# Patient Record
Sex: Male | Born: 1980 | Race: White | Hispanic: No | Marital: Married | Smoking: Current every day smoker
Health system: Southern US, Community
[De-identification: ages and names within clinical notes are randomized; demographics above are authoritative.]

## PROBLEM LIST (undated history)

## (undated) DIAGNOSIS — M75100 Unspecified rotator cuff tear or rupture of unspecified shoulder, not specified as traumatic: Secondary | ICD-10-CM

## (undated) DIAGNOSIS — F419 Anxiety disorder, unspecified: Secondary | ICD-10-CM

## (undated) DIAGNOSIS — R51 Headache: Secondary | ICD-10-CM

## (undated) DIAGNOSIS — M1990 Unspecified osteoarthritis, unspecified site: Secondary | ICD-10-CM

## (undated) DIAGNOSIS — E162 Hypoglycemia, unspecified: Secondary | ICD-10-CM

## (undated) DIAGNOSIS — K219 Gastro-esophageal reflux disease without esophagitis: Secondary | ICD-10-CM

## (undated) DIAGNOSIS — M25519 Pain in unspecified shoulder: Secondary | ICD-10-CM

## (undated) DIAGNOSIS — R05 Cough: Secondary | ICD-10-CM

## (undated) DIAGNOSIS — G47419 Narcolepsy without cataplexy: Secondary | ICD-10-CM

## (undated) DIAGNOSIS — R519 Headache, unspecified: Secondary | ICD-10-CM

## (undated) DIAGNOSIS — R112 Nausea with vomiting, unspecified: Secondary | ICD-10-CM

## (undated) DIAGNOSIS — Z9889 Other specified postprocedural states: Secondary | ICD-10-CM

## (undated) DIAGNOSIS — R7303 Prediabetes: Secondary | ICD-10-CM

## (undated) HISTORY — PX: CYSTECTOMY: SUR359

## (undated) HISTORY — PX: TYMPANOSTOMY TUBE PLACEMENT: SHX32

---
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
## 2004-05-02 ENCOUNTER — Emergency Department (HOSPITAL_COMMUNITY): Admission: EM | Admit: 2004-05-02 | Discharge: 2004-05-02 | Payer: Self-pay | Admitting: Emergency Medicine

## 2011-08-31 DIAGNOSIS — M25519 Pain in unspecified shoulder: Secondary | ICD-10-CM

## 2011-08-31 HISTORY — DX: Pain in unspecified shoulder: M25.519

## 2011-11-30 ENCOUNTER — Ambulatory Visit
Admission: RE | Admit: 2011-11-30 | Discharge: 2011-11-30 | Disposition: A | Discharge status: Home / Self Care | Payer: 59 | Source: Ambulatory Visit | Attending: Physician Assistant | Admitting: Physician Assistant

## 2011-11-30 ENCOUNTER — Other Ambulatory Visit: Payer: Self-pay | Admitting: Physician Assistant

## 2011-11-30 DIAGNOSIS — M25511 Pain in right shoulder: Secondary | ICD-10-CM

## 2012-05-20 ENCOUNTER — Other Ambulatory Visit: Payer: Self-pay | Admitting: Family Medicine

## 2012-05-20 DIAGNOSIS — M25511 Pain in right shoulder: Secondary | ICD-10-CM

## 2012-05-20 DIAGNOSIS — R109 Unspecified abdominal pain: Secondary | ICD-10-CM

## 2012-05-30 ENCOUNTER — Other Ambulatory Visit: Payer: 59

## 2012-06-06 ENCOUNTER — Ambulatory Visit
Admission: RE | Admit: 2012-06-06 | Discharge: 2012-06-06 | Disposition: A | Discharge status: Home / Self Care | Payer: 59 | Source: Ambulatory Visit | Attending: Family Medicine | Admitting: Family Medicine

## 2012-06-06 DIAGNOSIS — M25511 Pain in right shoulder: Secondary | ICD-10-CM

## 2012-06-06 DIAGNOSIS — M25512 Pain in left shoulder: Secondary | ICD-10-CM

## 2012-06-06 DIAGNOSIS — R109 Unspecified abdominal pain: Secondary | ICD-10-CM

## 2012-06-06 MED ORDER — IOHEXOL 300 MG/ML  SOLN
125.0000 mL | Freq: Once | INTRAMUSCULAR | Status: AC | PRN
  Administered 2012-06-06: 125 mL via INTRAVENOUS

## 2012-06-23 ENCOUNTER — Encounter (HOSPITAL_COMMUNITY): Payer: Self-pay | Admitting: Pharmacy Technician

## 2012-06-30 ENCOUNTER — Encounter (HOSPITAL_COMMUNITY): Payer: Self-pay | Admitting: *Deleted

## 2012-06-30 NOTE — Pre-Procedure Instructions (Addendum)
Your procedure is scheduled on: Wednesday, July 02, 2012 Report to [facility] Admitting [id number] Call this number if you have problems morning of your procedure:[phone]  Follow all bowel prep instructions per your doctor's orders. Dr. Dulce Sellar Do not eat or drink anything after midnight the night before your procedure. You may brush your teeth, rinse out your mouth, but no water, no food, no chewing gum, no mints, no candies, no chewing tobacco. Stopping Mobic     Take these medicines the morning of your procedure with A SIP OF WATER: Nexium   Please make arrangements for a responsible person to drive you home after the procedure. You cannot go home by cab/taxi. We recommend you have someone with you at home the first 24 hours after your procedure. Driver for procedure is  [employer]  LEAVE ALL VALUABLES, JEWELRY, BILLFOLD AT HOME.  NO DENTURES, CONTACT LENSES ALLOWED IN THE ENDOSCOPY ROOM.   YOU MAY WEAR DEODORANT, PLEASE REMOVE ALL JEWELRY, WATCHES RINGS, BODY PIERCINGS AND LEAVE AT HOME.   WOMEN: NO MAKE-UP, LOTIONS PERFUMES

## 2012-07-02 ENCOUNTER — Encounter (HOSPITAL_COMMUNITY): Payer: Self-pay | Admitting: *Deleted

## 2012-07-02 ENCOUNTER — Encounter (HOSPITAL_COMMUNITY)
Admission: RE | Disposition: A | Discharge status: Home / Self Care | Payer: Self-pay | Source: Ambulatory Visit | Attending: Gastroenterology

## 2012-07-02 ENCOUNTER — Ambulatory Visit (HOSPITAL_COMMUNITY): Payer: 59 | Admitting: Anesthesiology

## 2012-07-02 ENCOUNTER — Ambulatory Visit (HOSPITAL_COMMUNITY)
Admission: RE | Admit: 2012-07-02 | Discharge: 2012-07-02 | Disposition: A | Discharge status: Home / Self Care | Payer: 59 | Source: Ambulatory Visit | Attending: Gastroenterology | Admitting: Gastroenterology

## 2012-07-02 ENCOUNTER — Encounter (HOSPITAL_COMMUNITY): Payer: Self-pay | Admitting: Anesthesiology

## 2012-07-02 DIAGNOSIS — D129 Benign neoplasm of anus and anal canal: Secondary | ICD-10-CM | POA: Insufficient documentation

## 2012-07-02 DIAGNOSIS — D128 Benign neoplasm of rectum: Secondary | ICD-10-CM | POA: Insufficient documentation

## 2012-07-02 DIAGNOSIS — R935 Abnormal findings on diagnostic imaging of other abdominal regions, including retroperitoneum: Secondary | ICD-10-CM | POA: Insufficient documentation

## 2012-07-02 DIAGNOSIS — R109 Unspecified abdominal pain: Secondary | ICD-10-CM | POA: Insufficient documentation

## 2012-07-02 HISTORY — DX: Pain in unspecified shoulder: M25.519

## 2012-07-02 HISTORY — DX: Narcolepsy without cataplexy: G47.419

## 2012-07-02 HISTORY — DX: Gastro-esophageal reflux disease without esophagitis: K21.9

## 2012-07-02 HISTORY — PX: COLONOSCOPY WITH PROPOFOL: SHX5780

## 2012-07-02 HISTORY — DX: Hypoglycemia, unspecified: E16.2

## 2012-07-02 LAB — GLUCOSE, CAPILLARY: Glucose-Capillary: 105 mg/dL — ABNORMAL HIGH (ref 70–99)

## 2012-07-02 SURGERY — COLONOSCOPY WITH PROPOFOL
Anesthesia: General

## 2012-07-02 MED ORDER — LACTATED RINGERS IV SOLN: INTRAVENOUS | Status: DC

## 2012-07-02 MED ORDER — MIDAZOLAM HCL 5 MG/5ML IJ SOLN
INTRAMUSCULAR | Status: DC | PRN
  Administered 2012-07-02: 2 mg via INTRAVENOUS

## 2012-07-02 MED ORDER — DICYCLOMINE HCL 10 MG PO CAPS: 10.0000 mg | ORAL_CAPSULE | Freq: Four times a day (QID) | ORAL | Status: DC | PRN

## 2012-07-02 MED ORDER — ONDANSETRON HCL 4 MG/2ML IJ SOLN
INTRAMUSCULAR | Status: DC | PRN
  Administered 2012-07-02: 4 mg via INTRAVENOUS

## 2012-07-02 MED ORDER — SODIUM CHLORIDE 0.9 % IV SOLN: INTRAVENOUS | Status: DC

## 2012-07-02 MED ORDER — FENTANYL CITRATE 0.05 MG/ML IJ SOLN
INTRAMUSCULAR | Status: DC | PRN
  Administered 2012-07-02: 50 ug via INTRAVENOUS

## 2012-07-02 MED ORDER — LACTATED RINGERS IV SOLN
INTRAVENOUS | Status: DC | PRN
  Administered 2012-07-02: 10:00:00 via INTRAVENOUS

## 2012-07-02 MED ORDER — KETAMINE HCL 10 MG/ML IJ SOLN
INTRAMUSCULAR | Status: DC | PRN
  Administered 2012-07-02: 30 mg via INTRAVENOUS
  Administered 2012-07-02: 10 mg via INTRAVENOUS
  Administered 2012-07-02 (×2): 20 mg via INTRAVENOUS

## 2012-07-02 MED ORDER — PROPOFOL INFUSION 10 MG/ML OPTIME
INTRAVENOUS | Status: DC | PRN
  Administered 2012-07-02: 120 ug/kg/min via INTRAVENOUS

## 2012-07-02 SURGICAL SUPPLY — 21 items

## 2012-07-02 NOTE — Anesthesia Preprocedure Evaluation (Addendum)
Anesthesia Evaluation  Patient identified by MRN, date of birth, ID band Patient awake    Reviewed: Allergy & Precautions, H&P , NPO status , Patient's Chart, lab work & pertinent test results  Airway Mallampati: I TM Distance: >3 FB Neck ROM: Full    Dental  (+) Dental Advisory Given and Teeth Intact   Pulmonary sleep apnea ,  breath sounds clear to auscultation  Pulmonary exam normal       Cardiovascular - CAD and - Past MI Rhythm:Regular Rate:Normal     Neuro/Psych negative neurological ROS  negative psych ROS   GI/Hepatic Neg liver ROS, GERD-  Medicated,  Endo/Other  negative endocrine ROS  Renal/GU negative Renal ROS     Musculoskeletal negative musculoskeletal ROS (+)   Abdominal   Peds  Hematology negative hematology ROS (+)   Anesthesia Other Findings   Reproductive/Obstetrics                         Anesthesia Physical Anesthesia Plan  ASA: II  Anesthesia Plan: General   Post-op Pain Management:    Induction: Intravenous  Airway Management Planned: Simple Face Mask  Additional Equipment:   Intra-op Plan:   Post-operative Plan:   Informed Consent: I have reviewed the patients History and Physical, chart, labs and discussed the procedure including the risks, benefits and alternatives for the proposed anesthesia with the patient or authorized representative who has indicated his/her understanding and acceptance.   Dental advisory given  Plan Discussed with: CRNA  Anesthesia Plan Comments:         Anesthesia Quick Evaluation

## 2012-07-02 NOTE — Anesthesia Postprocedure Evaluation (Signed)
Anesthesia Post Note  Patient: Frank Moyer  Procedure(s) Performed: Procedure(s) (LRB): COLONOSCOPY WITH PROPOFOL (N/A)  Anesthesia type: MAC  Patient location: PACU  Post pain: Pain level controlled  Post assessment: Post-op Vital signs reviewed  Last Vitals: BP 143/93  Temp 36.5 C (Axillary)  Resp 17  Ht 6' (1.829 m)  Wt 226 lb (102.513 kg)  BMI 30.65 kg/m2  SpO2 94%  Post vital signs: Reviewed  Level of consciousness: awake  Complications: No apparent anesthesia complications

## 2012-07-02 NOTE — Op Note (Signed)
[facility] [address] [place], [address]   COLONOSCOPY PROCEDURE REPORT  PATIENT: Frank Moyer, Frank Moyer  MR#: [id number] BIRTHDATE: 1980-11-29 , 31  yrs. old GENDER: Male ENDOSCOPIST: Willis Modena, MD REFERRED EX:BMWUXLK Harris, M.D. PROCEDURE DATE:  07/02/2012 PROCEDURE:   Colonoscopy with biopsy ASA CLASS:   Class II INDICATIONS:abdominal pain, abnormal CT abdomen/pelvis. MEDICATIONS: MAC sedation, administered by CRNA DESCRIPTION OF PROCEDURE:   After the risks benefits and alternatives of the procedure were thoroughly explained, informed consent was obtained.  A digital rectal exam revealed no abnormalities of the rectum.   The Pentax Ped Colon [id number] endoscope was introduced through the anus and advanced to the terminal ileum which was intubated for a short distance. No adverse events experienced.   The quality of the prep was good.  The instrument was then slowly withdrawn as the colon was fully examined.   Findings:  Normal digital rectal exam; prep quality was good.  Few sigmoid diverticula.  Few hyperplastic-appearing rectosigmoid polyps, extensively biopsied with cold forceps.  No other polyps, masses, vascular ectasias, or inflammatory changes were seen. Distal 5cm of the terminal ileum was normal.  Retroflexed view of rectum was normal.      Withdrawal time was over r10 minutes     . The scope was withdrawn and the procedure completed.  ENDOSCOPIC IMPRESSION:     As above.  No colitis identified.  No explanation for patient's abdominal pain seen.  Perhaps pain could be due to the cutaneous inflammatory changes seen on CT scan around umbilicus and pubic symphysis.  RECOMMENDATIONS:     1.  Watch for potential complications of procedure. 2.  Await biopsy results; if hyperplastic, repeat colonoscopy at age 38, if adenomatous repeat 5 years. 3.  Trial of antispasmodics for abdominal pain. 4.  Follow-up with [facility] in 6-8 weeks, pending  clinical course of abdominal pain.  eSigned:  Willis Modena, MD 07/02/2012 11:09 AM   cc:

## 2012-07-02 NOTE — Transfer of Care (Signed)
Immediate Anesthesia Transfer of Care Note  Patient: Frank Moyer  Procedure(s) Performed: Procedure(s) (LRB) with comments: COLONOSCOPY WITH PROPOFOL (N/A)  Patient Location: PACU, Radiology and Endoscopy Unit  Anesthesia Type:MAC  Level of Consciousness: awake and alert   Airway & Oxygen Therapy: Patient Spontanous Breathing and Patient connected to face mask oxygen  Post-op Assessment: Report given to PACU RN and Post -op Vital signs reviewed and stable  Post vital signs: Reviewed and stable  Complications: No apparent anesthesia complications

## 2012-07-02 NOTE — H&P (Signed)
Patient interval history reviewed.  Patient examined again.  There has been no change from documented H/P dated 06/13/12 (scanned into chart from our office) except as documented above.  Assessment:  1.  Abdominal pain. 2.  Abnormal CT abd/pelvis, terminal ileal and colonic thickening.  Plan:  1.  Colonoscopy. 2.  Risks (bleeding, infection, bowel perforation that could require surgery, sedation-related changes in cardiopulmonary systems), benefits (identification and possible treatment of source of symptoms, exclusion of certain causes of symptoms), and alternatives (watchful waiting, radiographic imaging studies, empiric medical treatment) of colonoscopy were explained to patient/fiancee in detail and patient wishes to proceed.

## 2012-07-03 ENCOUNTER — Encounter (HOSPITAL_COMMUNITY): Payer: Self-pay | Admitting: Gastroenterology

## 2012-11-21 ENCOUNTER — Ambulatory Visit: Payer: 59 | Admitting: Endocrinology

## 2012-12-05 ENCOUNTER — Ambulatory Visit (INDEPENDENT_AMBULATORY_CARE_PROVIDER_SITE_OTHER): Payer: 59 | Admitting: Endocrinology

## 2012-12-05 ENCOUNTER — Ambulatory Visit: Payer: 59 | Admitting: Endocrinology

## 2012-12-05 ENCOUNTER — Encounter: Payer: Self-pay | Admitting: Endocrinology

## 2012-12-05 VITALS — BP 120/74 | HR 80 | Ht 69.0 in | Wt 216.0 lb

## 2012-12-05 DIAGNOSIS — R51 Headache: Secondary | ICD-10-CM

## 2012-12-05 DIAGNOSIS — G47419 Narcolepsy without cataplexy: Secondary | ICD-10-CM

## 2012-12-05 DIAGNOSIS — K219 Gastro-esophageal reflux disease without esophagitis: Secondary | ICD-10-CM

## 2012-12-05 DIAGNOSIS — R739 Hyperglycemia, unspecified: Secondary | ICD-10-CM

## 2012-12-05 DIAGNOSIS — F411 Generalized anxiety disorder: Secondary | ICD-10-CM

## 2012-12-05 DIAGNOSIS — R7309 Other abnormal glucose: Secondary | ICD-10-CM

## 2012-12-05 DIAGNOSIS — R519 Headache, unspecified: Secondary | ICD-10-CM | POA: Insufficient documentation

## 2012-12-05 NOTE — Patient Instructions (Addendum)
i'll review blood test results when available. You are on your way to developing diabetes.   check your blood sugar once a day.  vary the time of day when you check, between before the 3 meals, and at bedtime.  also check if you have symptoms of your blood sugar being too high or too low.  please keep a record of the readings and bring it to your next appointment here.  please call us sooner if your blood sugar goes below 70, or if you have a lot of readings over 200.   Please check a 24-hr urine test.  We'll contact you with results. Please come back for a follow-up appointment in 2 months.

## 2012-12-05 NOTE — Progress Notes (Signed)
Subjective:    Patient ID: Frank Moyer, male    DOB: 1981/04/16, 32 y.o.   MRN: [id number]  HPI Pt has 1 year of variable cbg's.  He has a few mos of moderate dizziness sensation in the head, and assoc weight loss.  he brings a record of his cbg's which i have reviewed today.  It varies from 61-400.  There is no trend throughout the day.   sxs do not correlate with cbg's.  Past Medical History  Diagnosis Date  . GERD (gastroesophageal reflux disease)   . Shoulder joint pain 08-2011  . Hypoglycemia   . Narcolepsy   . Sleep apnea     does not have cpap uses o2 2liters at hs  . On home oxygen therapy     uses 2 liters min at night    Past Surgical History  Procedure Laterality Date  . Cyst excision  1998    Bladder  . Tubes in ear    . Colonoscopy with propofol  07/02/2012    Procedure: COLONOSCOPY WITH PROPOFOL;  Surgeon: Willis Modena, MD;  Location: [facility] ENDOSCOPY;  Service: Endoscopy;  Laterality: N/A;    History   Social History  . Marital Status: Single    Spouse Name: N/A    Number of Children: N/A  . Years of Education: N/A   Occupational History  . Not on file.   Social History Main Topics  . Smoking status: Current Every Day Smoker -- 0.50 packs/day for 23 years  . Smokeless tobacco: Never Used  . Alcohol Use: No  . Drug Use: No  . Sexually Active:    Other Topics Concern  . Not on file   Social History Narrative  . No narrative on file    Current Outpatient Prescriptions on File Prior to Visit  Medication Sig Dispense Refill  . amphetamine-dextroamphetamine (ADDERALL XR) 15 MG 24 hr capsule Take 15 mg by mouth every morning.      . Armodafinil (NUVIGIL) 250 MG tablet Take 250 mg by mouth daily.      Marland Kitchen dicyclomine (BENTYL) 10 MG capsule Take 1 capsule (10 mg total) by mouth 4 (four) times daily as needed.  120 capsule  2  . esomeprazole (NEXIUM) 40 MG capsule Take 40 mg by mouth daily before breakfast.      . meloxicam (MOBIC) 15 MG tablet Take 15 mg  by mouth daily.      . Multiple Vitamin (MULTIVITAMIN WITH MINERALS) TABS Take 1 tablet by mouth daily.       No current facility-administered medications on file prior to visit.   Allergies  Allergen Reactions  . Adderall (Amphetamine-Dextroamphetamine)   . Penicillins Other (See Comments)    Been to long  . Septra (Sulfamethoxazole-Tmp Ds) Other (See Comments)    Unknown    No family history on file. DM: both parents and 1 sib BP 120/74  Pulse 80  Ht 5\' 9"  (1.753 m)  Wt 216 lb (97.977 kg)  BMI 31.88 kg/m2  SpO2 98%  Review of Systems denies fever, chest pain, sob, n/v, memory loss, depression, rhinorrhea, and easy bruising.  Pt reports fatigue, urinary frequency, acral numbness, leg cramps, blurry vision, headache, depression, excessive diaphoresis, and tremor.  He had a near-syncopal episode.      Objective:   Physical Exam VS: see vs page GEN: no distress HEAD: head: no deformity eyes: no periorbital swelling, no proptosis external nose and ears are normal mouth: no lesion seen NECK: supple, thyroid  is not enlarged CHEST WALL: no deformity LUNGS: clear to auscultation BREASTS:  No gynecomastia CV: reg rate and rhythm, no murmur ABD: abdomen is soft, nontender.  no hepatosplenomegaly.  not distended.  no hernia.   MUSCULOSKELETAL: muscle bulk and strength are grossly normal.  no obvious joint swelling.  gait is normal and steady EXTEMITIES: no deformity.  no ulcer on the feet.  feet are of normal color and temp.  no edema.  There is bilateral onychomycosis.   PULSES: dorsalis pedis intact bilat.  no carotid bruit NEURO:  cn 2-12 grossly intact.   readily moves all 4's.  sensation is intact to touch on the feet SKIN:  Normal texture and temperature.  No rash or suspicious lesion is visible.  Not diaphoretic NODES:  None palpable at the neck PSYCH: alert, oriented x3.  Does not appear anxious nor depressed.     Assessment & Plan:  Hyperglycemia: i need to know a1c  in order to interpret this.  Pt signs release of info today.  He is at high risk for DM.   Weight loss, uncertain etiology.  This suggests hyperglycemia may have been worse 6-12 months ago.   Narcolepsy.  Wife says this does not correlate with glucose, so it is very unlikely to be related.

## 2012-12-06 DIAGNOSIS — K219 Gastro-esophageal reflux disease without esophagitis: Secondary | ICD-10-CM | POA: Insufficient documentation

## 2012-12-06 DIAGNOSIS — F411 Generalized anxiety disorder: Secondary | ICD-10-CM | POA: Insufficient documentation

## 2012-12-06 DIAGNOSIS — G47419 Narcolepsy without cataplexy: Secondary | ICD-10-CM | POA: Insufficient documentation

## 2012-12-06 DIAGNOSIS — R739 Hyperglycemia, unspecified: Secondary | ICD-10-CM | POA: Insufficient documentation

## 2012-12-08 ENCOUNTER — Telehealth: Payer: Self-pay | Admitting: Endocrinology

## 2012-12-08 MED ORDER — GLUCOSE BLOOD VI STRP: ORAL_STRIP | Status: AC

## 2012-12-08 NOTE — Telephone Encounter (Signed)
Patient's significant other is requesting a CB to confirm that Rx can be done.

## 2012-12-12 LAB — METANEPHRINES, URINE, 24 HOUR: Normetanephrine, 24H Ur: 478 mcg/24 h (ref 35–482)

## 2012-12-13 LAB — CATECHOLAMINES, FRACTIONATED, URINE, 24 HOUR
Calculated Total (E+NE): 68 mcg/24 h (ref 26–121)
Creatinine, Urine mg/day-CATEUR: 2 g/(24.h) (ref 0.63–2.50)
Epinephrine, 24 hr Urine: 10 mcg/24 h (ref 2–24)
Norepinephrine, 24 hr Ur: 58 mcg/24 h (ref 15–100)

## 2012-12-15 ENCOUNTER — Telehealth: Payer: Self-pay | Admitting: Endocrinology

## 2012-12-15 NOTE — Telephone Encounter (Signed)
Frank Moyer advised of results

## 2012-12-15 NOTE — Telephone Encounter (Signed)
i reviewed labs.  Your a1c=5.0.  This is a good result.  i believe this will increase with time, so you should have rechecked in 6 months.  i would be happy to do this, or your providers at [facility] can do.

## 2012-12-15 NOTE — Telephone Encounter (Signed)
Labs on your desk for review.

## 2012-12-15 NOTE — Telephone Encounter (Signed)
Pt's fiance, Victorino Dike called. She says [facility]  was faxing lab results to our office for review. She wants to know 1-if we've gotten those results, 2-what did Dr. Everardo All think of the results, 3-does the pt have the start of DM? Please call back at [phone] / Sherri S.

## 2012-12-19 ENCOUNTER — Encounter: Payer: Self-pay | Admitting: Nurse Practitioner

## 2012-12-19 ENCOUNTER — Ambulatory Visit (INDEPENDENT_AMBULATORY_CARE_PROVIDER_SITE_OTHER): Payer: 59 | Admitting: Nurse Practitioner

## 2012-12-19 VITALS — BP 131/75 | HR 68 | Ht 71.0 in | Wt 218.0 lb

## 2012-12-19 DIAGNOSIS — G47419 Narcolepsy without cataplexy: Secondary | ICD-10-CM

## 2012-12-19 DIAGNOSIS — G4710 Hypersomnia, unspecified: Secondary | ICD-10-CM

## 2012-12-19 MED ORDER — ARMODAFINIL 250 MG PO TABS: 250.0000 mg | ORAL_TABLET | Freq: Every day | ORAL | Status: DC

## 2012-12-19 NOTE — Progress Notes (Signed)
HPI: Patient returns for followup since his last visit with Dr. Vickey Huger 06/20/2012. He has a history of severe hypersomnia. Sleep study in 2007 and 2008 was borderline sleep apnea but to mild to start CPAP at that time.  Has diagnosis of childhood ADHD and  does not want to take stimulants.He never had a PSG with MSLT. He is currently on Nuvigil doing well and his ESS score today is 4. He is not taking Adderall and Ritalin made him moody and aggressive. He has no new neurologic complaints.   ROS:  negative  Physical Exam General: well developed, well nourished, seated, in no evident distress Head: head normocephalic and atraumatic. Oropharynx benign Neck: supple with no carotid or supraclavicular bruits Cardiovascular: regular rate and rhythm, no murmurs  Neurologic Exam Mental Status: Awake and fully alert. Oriented to place and time. Follows all commands . Epworth 4.   Cranial Nerves:  Pupils equal, briskly reactive to light. Extraocular movements full without nystagmus. Visual fields full to confrontation. Hearing intact and symmetric to finger snap. Facial sensation intact. Face, tongue, palate move normally and symmetrically. Neck flexion and extension normal.  Motor: Normal bulk and tone. Normal strength in all tested extremity muscles. No focal weakness Sensory.: intact to touch and pinprick and vibratory.  Coordination: Rapid alternating movements normal in all extremities. Finger-to-nose and heel-to-shin performed accurately bilaterally. Gait and Station: Arises from chair without difficulty. Stance is normal. Gait demonstrates normal stride length and balance . Able to heel, toe and tandem walk without difficulty.  Reflexes: 2+ and symmetric. Toes downgoing.     ASSESSMENT: Hypersomnia with strong features of narcolepsy. Currently on Nuvigil doing well. Epworth 4 today     PLAN:  Continue Nuvigil 250 mg daily will refill Follow up with Dr. Vickey Huger in 6-8 months   Nilda Riggs, [place] APRN

## 2012-12-19 NOTE — Patient Instructions (Addendum)
Continue Nuvigil 250 mg daily will refill Follow up with Dr. Vickey Huger in 6-8 months ESS score excellent

## 2013-02-09 ENCOUNTER — Telehealth: Payer: Self-pay | Admitting: Endocrinology

## 2013-02-13 ENCOUNTER — Ambulatory Visit: Payer: 59 | Admitting: Endocrinology

## 2013-03-11 ENCOUNTER — Telehealth: Payer: Self-pay | Admitting: Neurology

## 2013-03-12 NOTE — Telephone Encounter (Signed)
I spoke to [employer] who had numerous concerns about patient.  The patient's PCP put him on Prozac in April and at first it worked fine, but a little over a month ago she noticed changes in him.Marland Kitchen He started falling asleep more frequently, and fell asleep at a work meeting and got written up.  Also is on light duty because of an injury at work and boss thought he was sleeping again.  She wants to know if the Prozac and Nuvigil could be working against each other.  She also went on about believing he has sleep apnea vs narcolepsy, and this may be contributing to his sleepiness.  She said he has been waking up gasping for air, having toe and leg cramps, more migraines, and urinating more at night.  Also they would like to appeal his med report done at work.  She would like him to be seen ASAP.  Please advise.

## 2013-03-13 NOTE — Telephone Encounter (Signed)
Spoke to [place] and relayed Dr. Oliva Bustard note about Prozac not causing interaction with nuvigil.  Also PSG showed severe hypersomnia and insignificant apnea.  Told her the doctor did not feel it necessary for patient to be seen.

## 2013-03-13 NOTE — Telephone Encounter (Signed)
I do not see a low dose of prozac causing interactions with nuvigil and sleepiness.  His PSG less than a year ago documented severe hypersomnia and  Insignificant apnea.  Please note that nuvigil  Effect may a have slowly decreased, and causes sleepiness.  No interaction concern on my part. Please let her know. Thanks, CD

## 2013-03-30 DIAGNOSIS — M75100 Unspecified rotator cuff tear or rupture of unspecified shoulder, not specified as traumatic: Secondary | ICD-10-CM

## 2013-03-30 HISTORY — DX: Unspecified rotator cuff tear or rupture of unspecified shoulder, not specified as traumatic: M75.100

## 2013-04-17 ENCOUNTER — Encounter (HOSPITAL_BASED_OUTPATIENT_CLINIC_OR_DEPARTMENT_OTHER): Payer: Self-pay | Admitting: *Deleted

## 2013-04-17 ENCOUNTER — Other Ambulatory Visit: Payer: Self-pay | Admitting: Orthopedic Surgery

## 2013-04-17 DIAGNOSIS — R059 Cough, unspecified: Secondary | ICD-10-CM

## 2013-04-17 HISTORY — DX: Cough, unspecified: R05.9

## 2013-04-17 NOTE — Pre-Procedure Instructions (Signed)
Discussed whether pt. should take Nuvigil AM DOS with Dr. Ivin Booty; he should take Nuvigil; pt. contacted and instructed to take Nuvigil AM DOS with sip water.  Pt. states he will get nauseated if he takes it without eating.  Instructed to bring meds. DOS, as anesthesiologist may want him to take it prior to surgery.

## 2013-04-20 ENCOUNTER — Encounter (HOSPITAL_BASED_OUTPATIENT_CLINIC_OR_DEPARTMENT_OTHER): Payer: Self-pay | Admitting: Anesthesiology

## 2013-04-20 ENCOUNTER — Ambulatory Visit (HOSPITAL_BASED_OUTPATIENT_CLINIC_OR_DEPARTMENT_OTHER): Payer: 59 | Admitting: Anesthesiology

## 2013-04-20 ENCOUNTER — Ambulatory Visit (HOSPITAL_BASED_OUTPATIENT_CLINIC_OR_DEPARTMENT_OTHER)
Admission: RE | Admit: 2013-04-20 | Discharge: 2013-04-20 | Disposition: A | Discharge status: Home / Self Care | Payer: 59 | Source: Ambulatory Visit | Attending: Orthopedic Surgery | Admitting: Orthopedic Surgery

## 2013-04-20 ENCOUNTER — Encounter (HOSPITAL_BASED_OUTPATIENT_CLINIC_OR_DEPARTMENT_OTHER): Payer: Self-pay | Admitting: *Deleted

## 2013-04-20 ENCOUNTER — Encounter (HOSPITAL_BASED_OUTPATIENT_CLINIC_OR_DEPARTMENT_OTHER)
Admission: RE | Disposition: A | Discharge status: Home / Self Care | Payer: Self-pay | Source: Ambulatory Visit | Attending: Orthopedic Surgery

## 2013-04-20 DIAGNOSIS — M75101 Unspecified rotator cuff tear or rupture of right shoulder, not specified as traumatic: Secondary | ICD-10-CM

## 2013-04-20 DIAGNOSIS — X58XXXA Exposure to other specified factors, initial encounter: Secondary | ICD-10-CM | POA: Insufficient documentation

## 2013-04-20 DIAGNOSIS — Y990 Civilian activity done for income or pay: Secondary | ICD-10-CM | POA: Insufficient documentation

## 2013-04-20 DIAGNOSIS — S43429A Sprain of unspecified rotator cuff capsule, initial encounter: Secondary | ICD-10-CM | POA: Insufficient documentation

## 2013-04-20 HISTORY — DX: Other specified postprocedural states: Z98.890

## 2013-04-20 HISTORY — DX: Headache: R51

## 2013-04-20 HISTORY — DX: Unspecified osteoarthritis, unspecified site: M19.90

## 2013-04-20 HISTORY — DX: Headache, unspecified: R51.9

## 2013-04-20 HISTORY — DX: Other specified postprocedural states: R11.2

## 2013-04-20 HISTORY — DX: Anxiety disorder, unspecified: F41.9

## 2013-04-20 HISTORY — DX: Cough: R05

## 2013-04-20 HISTORY — DX: Unspecified rotator cuff tear or rupture of unspecified shoulder, not specified as traumatic: M75.100

## 2013-04-20 HISTORY — PX: SHOULDER ARTHROSCOPY WITH ROTATOR CUFF REPAIR: SHX5685

## 2013-04-20 LAB — POCT HEMOGLOBIN-HEMACUE: Hemoglobin: 16.3 g/dL (ref 13.0–17.0)

## 2013-04-20 SURGERY — ARTHROSCOPY, SHOULDER, WITH ROTATOR CUFF REPAIR
Anesthesia: General | Site: Shoulder | Laterality: Right | Wound class: Clean

## 2013-04-20 MED ORDER — LIDOCAINE HCL (CARDIAC) 20 MG/ML IV SOLN
INTRAVENOUS | Status: DC | PRN
  Administered 2013-04-20: 100 mg via INTRAVENOUS

## 2013-04-20 MED ORDER — HYDROCODONE-ACETAMINOPHEN 5-325 MG PO TABS: 1.0000 | ORAL_TABLET | ORAL | Status: DC | PRN

## 2013-04-20 MED ORDER — DEXAMETHASONE SODIUM PHOSPHATE 4 MG/ML IJ SOLN
INTRAMUSCULAR | Status: DC | PRN
  Administered 2013-04-20: 10 mg via INTRAVENOUS

## 2013-04-20 MED ORDER — OXYCODONE HCL 5 MG/5ML PO SOLN: 5.0000 mg | Freq: Once | ORAL | Status: DC | PRN

## 2013-04-20 MED ORDER — PROPOFOL 10 MG/ML IV BOLUS
INTRAVENOUS | Status: DC | PRN
  Administered 2013-04-20: 200 mg via INTRAVENOUS

## 2013-04-20 MED ORDER — LACTATED RINGERS IV SOLN
INTRAVENOUS | Status: DC
  Administered 2013-04-20: 20 mL/h via INTRAVENOUS
  Administered 2013-04-20 (×2): via INTRAVENOUS

## 2013-04-20 MED ORDER — MIDAZOLAM HCL 2 MG/2ML IJ SOLN
1.0000 mg | INTRAMUSCULAR | Status: DC | PRN
  Administered 2013-04-20: 1 mg via INTRAVENOUS

## 2013-04-20 MED ORDER — FENTANYL CITRATE 0.05 MG/ML IJ SOLN
INTRAMUSCULAR | Status: DC | PRN
  Administered 2013-04-20: 100 ug via INTRAVENOUS

## 2013-04-20 MED ORDER — ONDANSETRON HCL 4 MG/2ML IJ SOLN: 4.0000 mg | Freq: Once | INTRAMUSCULAR | Status: DC | PRN

## 2013-04-20 MED ORDER — BUPIVACAINE-EPINEPHRINE PF 0.5-1:200000 % IJ SOLN
INTRAMUSCULAR | Status: DC | PRN
  Administered 2013-04-20: 30 mL

## 2013-04-20 MED ORDER — ONDANSETRON HCL 4 MG/2ML IJ SOLN
INTRAMUSCULAR | Status: DC | PRN
  Administered 2013-04-20: 4 mg via INTRAVENOUS

## 2013-04-20 MED ORDER — FENTANYL CITRATE 0.05 MG/ML IJ SOLN
50.0000 ug | INTRAMUSCULAR | Status: DC | PRN
  Administered 2013-04-20: 50 ug via INTRAVENOUS

## 2013-04-20 MED ORDER — OXYCODONE HCL 5 MG PO TABS
5.0000 mg | ORAL_TABLET | Freq: Once | ORAL | Status: DC | PRN
Start: 2013-04-20 — End: 2013-04-20

## 2013-04-20 MED ORDER — HYDROMORPHONE HCL PF 1 MG/ML IJ SOLN
0.2500 mg | INTRAMUSCULAR | Status: DC | PRN
  Administered 2013-04-20 (×2): 0.5 mg via INTRAVENOUS

## 2013-04-20 MED ORDER — SODIUM CHLORIDE 0.9 % IR SOLN
Status: DC | PRN
  Administered 2013-04-20 (×2): 3000 mL

## 2013-04-20 MED ORDER — SUCCINYLCHOLINE CHLORIDE 20 MG/ML IJ SOLN
INTRAMUSCULAR | Status: DC | PRN
  Administered 2013-04-20: 50 mg via INTRAVENOUS

## 2013-04-20 MED ORDER — MIDAZOLAM HCL 2 MG/ML PO SYRP: 12.0000 mg | ORAL_SOLUTION | Freq: Once | ORAL | Status: DC | PRN

## 2013-04-20 MED ORDER — CLINDAMYCIN PHOSPHATE 600 MG/50ML IV SOLN
INTRAVENOUS | Status: DC | PRN
  Administered 2013-04-20: 600 mg via INTRAVENOUS

## 2013-04-20 MED ORDER — MEPERIDINE HCL 25 MG/ML IJ SOLN: 6.2500 mg | INTRAMUSCULAR | Status: DC | PRN

## 2013-04-20 SURGICAL SUPPLY — 78 items
ANCHOR CORKSCREW FIBER 5.5X15 (Anchor) ×2 IMPLANT
BENZOIN TINCTURE PRP APPL 2/3 (GAUZE/BANDAGES/DRESSINGS) IMPLANT
BLADE SURG 15 STRL LF DISP TIS (BLADE) IMPLANT
BLADE SURG 15 STRL SS (BLADE)
BLADE SURG ROTATE 9660 (MISCELLANEOUS) IMPLANT
BUR OVAL 4.0 (BURR) ×2 IMPLANT
CANISTER OMNI JUG 16 LITER (MISCELLANEOUS) ×2 IMPLANT
CANISTER SUCTION 2500CC (MISCELLANEOUS) IMPLANT
CANNULA 5.75X71 LONG (CANNULA) ×2 IMPLANT
CANNULA TWIST IN 8.25X7CM (CANNULA) ×2 IMPLANT
CHLORAPREP W/TINT 26ML (MISCELLANEOUS) ×2 IMPLANT
CLOTH BEACON ORANGE TIMEOUT ST (SAFETY) ×2 IMPLANT
DECANTER SPIKE VIAL GLASS SM (MISCELLANEOUS) IMPLANT
DERMABOND ADVANCED (GAUZE/BANDAGES/DRESSINGS)
DERMABOND ADVANCED .7 DNX12 (GAUZE/BANDAGES/DRESSINGS) IMPLANT
DRAPE INCISE IOBAN 66X45 STRL (DRAPES) ×2 IMPLANT
DRAPE STERI 35X30 U-POUCH (DRAPES) ×2 IMPLANT
DRAPE SURG 17X23 STRL (DRAPES) ×2 IMPLANT
DRAPE U 20/CS (DRAPES) ×2 IMPLANT
DRAPE U-SHAPE 47X51 STRL (DRAPES) ×2 IMPLANT
DRAPE U-SHAPE 76X120 STRL (DRAPES) ×4 IMPLANT
DRSG PAD ABDOMINAL 8X10 ST (GAUZE/BANDAGES/DRESSINGS) ×2 IMPLANT
ELECT REM PT RETURN 9FT ADLT (ELECTROSURGICAL) ×2
ELECTRODE REM PT RTRN 9FT ADLT (ELECTROSURGICAL) ×1 IMPLANT
GAUZE SPONGE 4X4 16PLY XRAY LF (GAUZE/BANDAGES/DRESSINGS) IMPLANT
GAUZE XEROFORM 1X8 LF (GAUZE/BANDAGES/DRESSINGS) ×2 IMPLANT
GLOVE BIO SURGEON STRL SZ7 (GLOVE) ×2 IMPLANT
GLOVE BIO SURGEON STRL SZ7.5 (GLOVE) ×2 IMPLANT
GLOVE BIOGEL PI IND STRL 7.0 (GLOVE) ×2 IMPLANT
GLOVE BIOGEL PI IND STRL 8 (GLOVE) ×1 IMPLANT
GLOVE BIOGEL PI INDICATOR 7.0 (GLOVE) ×2
GLOVE BIOGEL PI INDICATOR 8 (GLOVE) ×1
GLOVE ECLIPSE 6.5 STRL STRAW (GLOVE) ×2 IMPLANT
GOWN BRE IMP PREV XXLGXLNG (GOWN DISPOSABLE) ×2 IMPLANT
GOWN PREVENTION PLUS XLARGE (GOWN DISPOSABLE) ×4 IMPLANT
NDL SUT 6 .5 CRC .975X.05 MAYO (NEEDLE) IMPLANT
NEEDLE 1/2 CIR CATGUT .05X1.09 (NEEDLE) IMPLANT
NEEDLE MAYO TAPER (NEEDLE)
NEEDLE SCORPION MULTI FIRE (NEEDLE) ×2 IMPLANT
NS IRRIG 1000ML POUR BTL (IV SOLUTION) IMPLANT
PACK ARTHROSCOPY DSU (CUSTOM PROCEDURE TRAY) ×2 IMPLANT
PACK BASIN DAY SURGERY FS (CUSTOM PROCEDURE TRAY) ×2 IMPLANT
PENCIL BUTTON HOLSTER BLD 10FT (ELECTRODE) IMPLANT
PUSHLOCK PEEK 4.5X24 (Orthopedic Implant) ×2 IMPLANT
RESECTOR FULL RADIUS 4.2MM (BLADE) ×2 IMPLANT
SLEEVE SCD COMPRESS KNEE MED (MISCELLANEOUS) ×2 IMPLANT
SLING ARM FOAM STRAP LRG (SOFTGOODS) IMPLANT
SLING ARM FOAM STRAP MED (SOFTGOODS) IMPLANT
SLING ARM FOAM STRAP XLG (SOFTGOODS) IMPLANT
SLING ARM IMMOBILIZER LRG (SOFTGOODS) ×2 IMPLANT
SLING ARM IMMOBILIZER MED (SOFTGOODS) IMPLANT
SPONGE GAUZE 4X4 12PLY (GAUZE/BANDAGES/DRESSINGS) ×2 IMPLANT
SPONGE LAP 4X18 X RAY DECT (DISPOSABLE) IMPLANT
STRIP CLOSURE SKIN 1/2X4 (GAUZE/BANDAGES/DRESSINGS) IMPLANT
SUCTION FRAZIER TIP 10 FR DISP (SUCTIONS) IMPLANT
SUPPORT WRAP ARM LG (MISCELLANEOUS) ×2 IMPLANT
SUT 2 FIBERLOOP 20 STRT BLUE (SUTURE)
SUT BONE WAX W31G (SUTURE) IMPLANT
SUT ETHILON 3 0 PS 1 (SUTURE) ×2 IMPLANT
SUT FIBERWIRE #2 38 T-5 BLUE (SUTURE)
SUT MNCRL AB 3-0 PS2 18 (SUTURE) IMPLANT
SUT MNCRL AB 4-0 PS2 18 (SUTURE) IMPLANT
SUT PDS AB 0 CT 36 (SUTURE) IMPLANT
SUT PROLENE 3 0 PS 2 (SUTURE) IMPLANT
SUT VIC AB 0 CT1 27 (SUTURE)
SUT VIC AB 0 CT1 27XBRD ANBCTR (SUTURE) IMPLANT
SUT VIC AB 2-0 SH 27 (SUTURE)
SUT VIC AB 2-0 SH 27XBRD (SUTURE) IMPLANT
SUTURE 2 FIBERLOOP 20 STRT BLU (SUTURE) IMPLANT
SUTURE FIBERWR #2 38 T-5 BLUE (SUTURE) IMPLANT
SYR BULB 3OZ (MISCELLANEOUS) IMPLANT
TOWEL OR 17X24 6PK STRL BLUE (TOWEL DISPOSABLE) ×2 IMPLANT
TOWEL OR NON WOVEN STRL DISP B (DISPOSABLE) ×2 IMPLANT
TUBE CONNECTING 20X1/4 (TUBING) ×2 IMPLANT
TUBING ARTHROSCOPY IRRIG 16FT (MISCELLANEOUS) ×2 IMPLANT
WAND STAR VAC 90 (SURGICAL WAND) ×2 IMPLANT
WATER STERILE IRR 1000ML POUR (IV SOLUTION) ×2 IMPLANT
YANKAUER SUCT BULB TIP NO VENT (SUCTIONS) IMPLANT

## 2013-04-20 NOTE — Op Note (Signed)
Procedure(s): SHOULDER ARTHROSCOPY WITH POSTERIOR ROTATOR CUFF REPAIR and debridement Procedure Note  Frank Moyer male 32 y.o. 04/20/2013  Procedure(s) and Anesthesia Type:    #1 right shoulder arthroscopic infraspinatus repair #2 right shoulder extensive debridement of partial subscapularis and supraspinatus tear  Surgeon(s) and Role:    * Mable Paris, MD - Primary     Surgeon: Mable Paris   Assistants:Danielle Courtney Heys PA-C (Danielle was present and scrubbed throughout the procedure and was essential in positioning, assisting with the camera and instrumentation,, and closure)  Anesthesia: General endotracheal anesthesia with preoperative interscalene block    Procedure Detail  Estimated Blood Loss: Min         Drains: none  Blood Given: none         Specimens: none        Complications:  * No complications entered in OR log *         Disposition: PACU - hemodynamically stable.         Condition: stable    Procedure:   INDICATIONS FOR SURGERY: The patient is 32 y.o. male who had a right shoulder injury at work and suffered a complete tear of the infraspinatus. He was indicated for surgical treatment to restore strength and function of the right shoulder and decrease pain. He understood risks benefits and alternatives to the procedure including but not limited to incomplete healing, incomplete pain relief, stiffness etc. He wished to go forward with surgery.  OPERATIVE FINDINGS: Examination under anesthesia: No stiffness or instability Diagnostic Arthroscopy:  Glenoid articular cartilage: Intact Humeral head articular cartilage: Intact Labrum: Intact Loose bodies: None Synovitis: Mild Articular sided rotator cuff: He had significant partial tearing of the articular surface of the subscapularis involving about 20-25% of the thickness of the tendon which was debrided back to healthy bleeding tissue. He also had partial-thickness tearing of  the anterior articular side of the supraspinatus tendon involving 10-20% of the tendon which was debrided. The posterior articular side of the infraspinatus was actually intact Bursal sided rotator cuff: He had very high grade 90% bursal sided partial tear of the infraspinatus which was completed and repaired Coracoacromial ligament: No significant fraying  DESCRIPTION OF PROCEDURE: The patient was identified in preoperative  holding area where I personally marked the operative site after  verifying site, side, and procedure with the patient. An interscalene block was given by the attending anesthesiologist the holding area.  The patient was taken back to the operating room where general anesthesia was induced without complication and was placed in the beach-chair position with the back  elevated about 60 degrees and all extremities and head and neck carefully padded and  positioned.   The right upper extremity was then prepped and  draped in a standard sterile fashion. The appropriate time-out  procedure was carried out. The patient did receive IV antibiotics  within 30 minutes of incision.   A small posterior portal incision was made and the arthroscope was introduced into the joint. An anterior portal was then established above the subscapularis using needle localization. Small cannula was placed anteriorly. Diagnostic arthroscopy was then carried out with findings as described above.  He was noted to have extensive partial thickness tearing of the articular surface of the subscapularis involving up to about 25-30% of the tendon thickness. This was debrided back to healthy bleeding tendon with a shaver through the anterior intra-articular portal. The remaining tendon was healthy and intact and no repair was necessary. The supraspinatus also had extensive  partial tearing with significant fraying of the articular side of the tendon which was debrided back to healthy bleeding tendon. There is no  full-thickness extension. This involved about 10-20% of the thickness of the tendon and no repair was necessary. The articular sided infraspinatus tendon was carefully examined and was viewed from both anterior and posterior portals and was found to be completely intact. The biceps tendon and superior labrum were intact. Articular surfaces were intact. No loose bodies were noted.  The arthroscope was then introduced into the subacromial space a standard lateral portal was established with needle localization. The shaver was used through the lateral portal to perform extensive bursectomy. Coracoacromial ligament was examined and found to be intact. After removing the bursa the anterior portion of the rotator cuff was noted to be completely intact. Initially the posterior portion was felt to be largely intact but after careful probing it was determined that there was some scar tissue in that there was a fairly significant tear in the infraspinatus which involved about 90% of the footprint of the infraspinatus on the greater tuberosity. Once the torn tissue was debrided away the healthy edge of the tendon was mobilized and was able to be pulled over the tuberosity. Therefore the tuberosity was debrided with a bur down to bleeding bone to promote healing. A 5.5 peak corkscrew anchor was placed just off the articular margin after completing the tear through the medial strand with a 11 blade. The 4 suture strands from the anchor were passed evenly spaced in the tear and the medial row was tied down. This brought the tendon nicely down over the prepared tuberosity. All 4 strands were then brought into a 4.5 mm peak push lock anchor in a lateral row bringing the tendon nicely down over the tuberosity. The repair was felt to be excellent. No undue tension on the repair. The remainder of the bursal rotator cuff is intact. There is no significant sign of impingement in the coracoacromial ligament was not taken off the  anterior acromion.  The arthroscopic equipment was removed from the joint and the portals were closed with 3-0 nylon in an interrupted fashion. Sterile dressings were then applied including Xeroform 4 x 4's ABDs and tape. The patient was then allowed to awaken from general anesthesia, placed in a sling, transferred to the stretcher and taken to the recovery room in stable condition.   POSTOPERATIVE PLAN: The patient will be discharged home today and will followup in one week for suture removal and wound check.  He will follow the standard cuff protocol.

## 2013-04-20 NOTE — H&P (Signed)
Frank Moyer is an 32 y.o. male.   Chief Complaint: R shoulder RC tear  HPI: S/p injury at work with complete infraspinatus tear   Past Medical History  Diagnosis Date  . GERD (gastroesophageal reflux disease)   . Shoulder joint pain 08-2011  . Hypoglycemia   . Narcolepsy   . PONV (postoperative nausea and vomiting)   . Sinus headache   . Cough 04/17/2013    to start antibiotic 04/17/2013  . Arthritis     right shoulder  . Rotator cuff tear 03/2013    right  . Anxiety     Past Surgical History  Procedure Laterality Date  . Cyst excision  1998    bladder  . Tympanostomy tube placement      as a child  . Colonoscopy with propofol  07/02/2012    Procedure: COLONOSCOPY WITH PROPOFOL;  Surgeon: Willis Modena, MD;  Location: [facility];  Service: Endoscopy;  Laterality: N/A;    Family History  Problem Relation Age of Onset  . Heart disease Father     MI   Social History:  reports that he has been smoking Cigarettes.  He has a 25 pack-year smoking history. He has never used smokeless tobacco. He reports that he does not drink alcohol or use illicit drugs.  Allergies:  Allergies  Allergen Reactions  . Adderall [Amphetamine-Dextroamphetamine] Other (See Comments)    MENTAL STATUS CHANGES  . Penicillins Other (See Comments)    UNKNOWN - AS A CHILD  . Septra [Sulfamethoxazole-Tmp Ds] Other (See Comments)    UNKNOWN     Medications Prior to Admission  Medication Sig Dispense Refill  . Armodafinil (NUVIGIL) 250 MG tablet Take 1 tablet (250 mg total) by mouth daily.  30 tablet  5  . esomeprazole (NEXIUM) 40 MG capsule Take 40 mg by mouth daily before breakfast.      . FLUoxetine (PROZAC) 20 MG tablet Take 10 mg by mouth daily.       Marland Kitchen levofloxacin (LEVAQUIN) 500 MG tablet Take 500 mg by mouth daily.      . meloxicam (MOBIC) 15 MG tablet Take 15 mg by mouth daily.      . vitamin B-12 (CYANOCOBALAMIN) 1000 MCG tablet Take 1,000 mcg by mouth daily.      Marland Kitchen glucose blood test  strip Use as instructed  100 each  12    Results for orders placed during the hospital encounter of 04/20/13 (from the past 48 hour(s))  POCT HEMOGLOBIN-HEMACUE     Status: None   Collection Time    04/20/13 10:04 AM      Result Value Range   Hemoglobin 16.3  13.0 - 17.0 g/dL   No results found.  Review of Systems  All other systems reviewed and are negative.    Blood pressure 130/77, pulse 79, temperature 98.2 F (36.8 C), temperature source Oral, resp. rate 16, height 6' (1.829 m), weight 96.163 kg (212 lb), SpO2 95.00%. Physical Exam  Constitutional: He is oriented to person, place, and time. He appears well-developed and well-nourished.  HENT:  Head: Atraumatic.  Eyes: EOM are normal.  Cardiovascular: Intact distal pulses.   Respiratory: Effort normal.  Musculoskeletal:       Right shoulder: He exhibits decreased range of motion, pain and decreased strength.  Neurological: He is alert and oriented to person, place, and time.  Skin: Skin is warm and dry.  Psychiatric: He has a normal mood and affect.     Assessment/Plan R infraspinatus tear  Plan arthroscopic repair Risks / benefits of surgery discussed Consent on chart  NPO for OR Preop antibiotics   Kateena Degroote WILLIAM 04/20/2013, 11:34 AM

## 2013-04-20 NOTE — Anesthesia Procedure Notes (Addendum)
Anesthesia Regional Block:  Interscalene brachial plexus block  Pre-Anesthetic Checklist: ,, timeout performed, Correct Patient, Correct Site, Correct Laterality, Correct Procedure, Correct Position, site marked, Risks and benefits discussed,  Surgical consent,  Pre-op evaluation,  At surgeon's request and post-op pain management  Laterality: Right  Prep: chloraprep       Needles:  Injection technique: Single-shot  Needle Type: Echogenic Stimulator Needle     Needle Length: 5cm 5 cm Needle Gauge: 21 and 21 G    Additional Needles:  Procedures: ultrasound guided (picture in chart) and nerve stimulator Interscalene brachial plexus block  Nerve Stimulator or Paresthesia:  Response: 0.4 mA,   Additional Responses:   Narrative:  Start time: 04/20/2013 10:18 AM End time: 04/20/2013 10:28 AM Injection made incrementally with aspirations every 5 mL.  Performed by: Personally  Anesthesiologist: Arta Bruce MD  Additional Notes: Monitors applied. Patient sedated. Sterile prep and drape,hand hygiene and sterile gloves were used. Relevant anatomy identified.Needle position confirmed.Local anesthetic injected incrementally after negative aspiration. Local anesthetic spread visualized around nerve(s). Vascular puncture avoided. No complications. Image printed for medical record.The patient tolerated the procedure well.       Interscalene brachial plexus block Procedure Name: Intubation Date/Time: 04/20/2013 11:55 AM Performed by: Zenia Resides D Pre-anesthesia Checklist: Patient identified, Emergency Drugs available, Suction available and Patient being monitored Patient Re-evaluated:Patient Re-evaluated prior to inductionOxygen Delivery Method: Circle System Utilized Preoxygenation: Pre-oxygenation with 100% oxygen Intubation Type: IV induction Ventilation: Mask ventilation without difficulty Laryngoscope Size: Mac and 3 Grade View: Grade I Tube type: Oral Tube size: 8.0  mm Number of attempts: 1 Airway Equipment and Method: stylet and oral airway Placement Confirmation: ETT inserted through vocal cords under direct vision,  positive ETCO2 and breath sounds checked- equal and bilateral Secured at: 22 cm Tube secured with: Tape Dental Injury: Teeth and Oropharynx as per pre-operative assessment

## 2013-04-20 NOTE — Transfer of Care (Signed)
Immediate Anesthesia Transfer of Care Note  Patient: Frank Moyer  Procedure(s) Performed: Procedure(s) with comments: SHOULDER ARTHROSCOPY WITH POSTERIOR ROTATOR CUFF REPAIR and debridement (Right) - Right shoulder arthroscopy posterior rotator cuff repair and debridement  Patient Location: PACU  Anesthesia Type:General and Regional  Level of Consciousness: awake  Airway & Oxygen Therapy: Patient Spontanous Breathing and Patient connected to face mask oxygen  Post-op Assessment: Report given to PACU RN and Post -op Vital signs reviewed and stable  Post vital signs: Reviewed and stable  Complications: No apparent anesthesia complications

## 2013-04-20 NOTE — Progress Notes (Signed)
Assisted Dr. Ossey with right, ultrasound guided, interscalene  block. Side rails up, monitors on throughout procedure. See vital signs in flow sheet. Tolerated Procedure well. 

## 2013-04-20 NOTE — Anesthesia Preprocedure Evaluation (Addendum)
Anesthesia Evaluation  Patient identified by MRN, date of birth, ID band Patient awake    Reviewed: Allergy & Precautions, H&P , NPO status , Patient's Chart, lab work & pertinent test results  History of Anesthesia Complications (+) PONV  Airway Mallampati: I TM Distance: >3 FB Neck ROM: Full    Dental   Pulmonary          Cardiovascular     Neuro/Psych Anxiety H/O narcolepsy on Nuvigil    GI/Hepatic GERD-  Medicated and Controlled,  Endo/Other    Renal/GU      Musculoskeletal   Abdominal   Peds  Hematology   Anesthesia Other Findings   Reproductive/Obstetrics                          Anesthesia Physical Anesthesia Plan  ASA: II  Anesthesia Plan: General   Post-op Pain Management:    Induction: Intravenous  Airway Management Planned: Oral ETT  Additional Equipment:   Intra-op Plan:   Post-operative Plan: Extubation in OR  Informed Consent: I have reviewed the patients History and Physical, chart, labs and discussed the procedure including the risks, benefits and alternatives for the proposed anesthesia with the patient or authorized representative who has indicated his/her understanding and acceptance.     Plan Discussed with: CRNA and Surgeon  Anesthesia Plan Comments:         Anesthesia Quick Evaluation

## 2013-04-20 NOTE — Anesthesia Postprocedure Evaluation (Signed)
Anesthesia Post Note  Patient: Frank Moyer  Procedure(s) Performed: Procedure(s) (LRB): SHOULDER ARTHROSCOPY WITH POSTERIOR ROTATOR CUFF REPAIR and debridement (Right)  Anesthesia type: general  Patient location: PACU  Post pain: Pain level controlled  Post assessment: Patient's Cardiovascular Status Stable  Last Vitals:  Filed Vitals:   04/20/13 1513  BP: 125/79  Pulse: 82  Temp: 36.6 C  Resp: 20    Post vital signs: Reviewed and stable  Level of consciousness: sedated  Complications: No apparent anesthesia complications

## 2013-04-21 ENCOUNTER — Encounter (HOSPITAL_BASED_OUTPATIENT_CLINIC_OR_DEPARTMENT_OTHER): Payer: Self-pay | Admitting: Orthopedic Surgery

## 2013-04-21 NOTE — Addendum Note (Signed)
Addendum created 04/21/13 0831 by Lance Coon, CRNA   Modules edited: Anesthesia Responsible Staff

## 2013-05-08 ENCOUNTER — Other Ambulatory Visit: Payer: Self-pay | Admitting: Neurology

## 2013-05-08 MED ORDER — ARMODAFINIL 250 MG PO TABS: 250.0000 mg | ORAL_TABLET | Freq: Every day | ORAL | Status: DC

## 2013-06-11 NOTE — Telephone Encounter (Signed)
completed

## 2013-07-10 ENCOUNTER — Ambulatory Visit: Payer: 59 | Admitting: Nurse Practitioner

## 2013-07-31 ENCOUNTER — Ambulatory Visit: Payer: Self-pay | Admitting: Neurology

## 2013-09-04 ENCOUNTER — Ambulatory Visit (INDEPENDENT_AMBULATORY_CARE_PROVIDER_SITE_OTHER): Payer: 59 | Admitting: Neurology

## 2013-09-04 ENCOUNTER — Encounter: Payer: Self-pay | Admitting: Neurology

## 2013-09-04 ENCOUNTER — Encounter (INDEPENDENT_AMBULATORY_CARE_PROVIDER_SITE_OTHER): Payer: Self-pay

## 2013-09-04 VITALS — BP 141/95 | HR 95 | Ht 71.5 in | Wt 226.0 lb

## 2013-09-04 DIAGNOSIS — F603 Borderline personality disorder: Secondary | ICD-10-CM

## 2013-09-04 DIAGNOSIS — G4710 Hypersomnia, unspecified: Secondary | ICD-10-CM

## 2013-09-04 MED ORDER — ARMODAFINIL 250 MG PO TABS
250.0000 mg | ORAL_TABLET | Freq: Every day | ORAL | Status: DC
Start: 2013-09-04 — End: 2014-03-11

## 2013-09-04 MED ORDER — ARMODAFINIL 50 MG PO TABS: 100.0000 mg | ORAL_TABLET | Freq: Every day | ORAL | Status: DC

## 2013-09-04 NOTE — Patient Instructions (Signed)
Narcolepsy Narcolepsy is a disabling neurological disorder of sleep regulation. It affects the control of sleep. It also affects the control of wakefulness. It is an interruption of the dreaming state of sleep. This state is known as REM or rapid eye movement sleep.  SYMPTOMS  The development, number, and severity of symptoms vary widely among people with the disorder. Symptoms generally begin between the ages of 15 and 30. The four classic symptoms of the disorder are:   Excessive daytime sleepiness.  Cataplexy. This is sudden, brief episodes of muscle weakness or paralysis. It is caused by strong emotions. Common strong emotions are laughter, anger, surprise, or anticipation.  Sleep paralysis. This is paralysis upon falling asleep or waking up.  Hallucinations. These are vivid dream-like images that occur at when you first fall asleep. Other symptoms include:   Unrelenting excessive sleepiness. This is usually the first and most obvious symptom.  Sleep attacks. Patients have strong sleep attacks throughout the day. These attacks can last for 30 seconds to more than 30 minutes. These happen no matter how much or how well the person slept the night before. These attacks end up making the person sleep at work and social events. The person can fall asleep while eating, talking, and driving. They also fall asleep at other out of place times.  Disturbed nighttime sleep.  Tossing and turning in bed.  Leg jerks.  Nightmares.  Waking up often. DIAGNOSIS  It's possible that genetics play a role in this disorder. Narcolepsy is not a rare disorder. It is often misdiagnosed. It is often diagnosed years after symptoms first appear. Early diagnosis and treatment are important. This help the physical and mental well-being of the patient. TREATMENT  There is no cure for narcolepsy. The symptoms can be controlled with behavioral and medical therapy. The excessive daytime sleepiness may be treated with  stimulant drugs. It may also be treated with the drug modafinil (Provigil). Cataplexy and other REM-sleep symptoms may be treated with antidepressant medications. Medications will reduce the symptoms. Medications will not ease symptoms entirely. Many available medications have side effects. Basic lifestyle changes may also reduce the symptoms. These changes include having regular sleep schedules and scheduled daytime naps. Other lifestyle changes include avoiding "over-stimulating" situations. Document Released: 07/06/2002 Document Revised: 10/08/2011 Document Reviewed: 07/16/2005 Exit[employer] Patient Information 2014 [employer].  

## 2013-09-04 NOTE — Progress Notes (Signed)
[name] returns today for a regular OB visit.  The patient has a history of severe hypersomnia, he also had responded to stimulants wears impulsivity, personality changes became angry and mean. He also had a paradoxical reaction as he became actually sleepy on stimulants.  Just recently his primary care physician tried Cymbalta and the patient again seemed to have a paradoxical sleep induction from this medication.  He had only taken it for 3 days it could be discontinued with old having to be weaned. Is no longer taking any stimulants but he continues to take Provigil 250 mg plus half tablet of 50 mg. He has no neurologic complaints.  The sleep habits are as follows the patient was to bed between 10 and 11 PM and he has to arise at 4:15 to 4:30 in the morning.  Overall he does not get a whole lot of sleep bili 6 hours but is working on advancing his bedtime further.  He denies any headaches or any jitteriness related to the Nuvigil ,   no palpitations no diaphoresis. 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  The patient's fianc asked if it could have been the [place] gas that contributed at his workplace to some of the sleepiness in the past. Also cannot exclude this,  I think it is unlikely that they would affect him over and over  again.   HPI: last office visit : Patient returns for followup since his last visit with Dr. Brett Fairy 06/20/2012. He has a history of severe hypersomnia. Sleep study in 2007 and 2008 was borderline sleep apnea but to mild to start CPAP at that time.  Has diagnosis of childhood ADHD and  does not want to take stimulants.He never had a PSG with MSLT. He is currently on Nuvigil doing well and his ESS score today is 4. He is not taking Adderall and  Ritalin made him moody and aggressive. He has no new neurologic complaints.     ROS:  negative  Physical Exam General: well developed, well nourished, seated, in no evident distress Head: head normocephalic and atraumatic. Oropharynx benign Neck: supple with no carotid or supraclavicular bruits Cardiovascular: regular rate and rhythm, no murmurs  Neurologic Exam Mental Status: Awake and fully alert. Oriented to place and time. Follows all commands . Epworth 4.   Cranial Nerves:  Pupils equal, briskly reactive to light. Extraocular movements full without nystagmus. Visual fields full to confrontation. Hearing intact and symmetric to finger snap. Facial sensation intact. Face, tongue, palate move normally and symmetrically. Neck flexion and extension normal.  Motor: Normal bulk and tone. Normal strength in all tested extremity muscles. No focal weakness Sensory.: intact to touch and pinprick and vibratory.  Coordination: Rapid alternating movements normal in all extremities. Finger-to-nose and heel-to-shin performed accurately bilaterally. Gait and Station: Arises from chair without difficulty. Stance is normal. Gait demonstrates normal stride length and balance . Able to heel, toe and tandem walk without difficulty.  Reflexes: 2+ and symmetric. Toes downgoing.     ASSESSMENT: Hypersomnia with strong features of narcolepsy.  Currently on Nuvigil doing well. Epworth 4 today- I have some concerns that he is not fully honest about the degree of sleepiness.   Last July the patient suffered a rotator cuff injury and  had surgery, he actually has been sleeping better since her surgical repair of the lesion and is less than pain. The patient enters the fatigue severity score today at 9 points in the for his questionnaire at 5 points, review of systems was engorged but nothing. No tremors or syncope no seizures no blood in the urine no vomiting fever or fatigue no excessive thirst and alternatives.  The patient is snoring more likely according to his fiance. This is however not related or correlated with an increased sleepiness score.       PLAN:  Continue Nuvigil 250 mg daily will refill, plus 50 mg Nuvigil. HLA narcolepsy test.  Follow up with Cecille Rubin, NP   in 12 months  Yarnell Kozloski, MD

## 2013-09-10 ENCOUNTER — Ambulatory Visit (INDEPENDENT_AMBULATORY_CARE_PROVIDER_SITE_OTHER): Payer: 59

## 2013-09-10 DIAGNOSIS — F603 Borderline personality disorder: Secondary | ICD-10-CM

## 2013-09-10 DIAGNOSIS — G4710 Hypersomnia, unspecified: Secondary | ICD-10-CM

## 2013-09-21 ENCOUNTER — Telehealth: Payer: Self-pay | Admitting: Neurology

## 2013-09-21 NOTE — Telephone Encounter (Signed)
Patient's fiancee calling to request patient's narcolepsy evaluation lab work results. Please call her back.

## 2013-09-22 NOTE — Telephone Encounter (Signed)
Information was sent to [facility]. Please advise

## 2013-09-22 NOTE — Telephone Encounter (Signed)
Spoke to patient's fiancee and relayed patient was negative for the genetic predisposition for narcolepsy.

## 2014-01-14 ENCOUNTER — Encounter (HOSPITAL_COMMUNITY): Payer: Self-pay | Admitting: Emergency Medicine

## 2014-01-14 ENCOUNTER — Emergency Department (HOSPITAL_COMMUNITY)
Admission: EM | Admit: 2014-01-14 | Discharge: 2014-01-15 | Disposition: A | Discharge status: Home / Self Care | Payer: 59 | Attending: Emergency Medicine | Admitting: Emergency Medicine

## 2014-01-14 DIAGNOSIS — F411 Generalized anxiety disorder: Secondary | ICD-10-CM | POA: Insufficient documentation

## 2014-01-14 DIAGNOSIS — Y939 Activity, unspecified: Secondary | ICD-10-CM | POA: Insufficient documentation

## 2014-01-14 DIAGNOSIS — R200 Anesthesia of skin: Secondary | ICD-10-CM

## 2014-01-14 DIAGNOSIS — F17200 Nicotine dependence, unspecified, uncomplicated: Secondary | ICD-10-CM | POA: Insufficient documentation

## 2014-01-14 DIAGNOSIS — IMO0001 Reserved for inherently not codable concepts without codable children: Secondary | ICD-10-CM | POA: Insufficient documentation

## 2014-01-14 DIAGNOSIS — Z8739 Personal history of other diseases of the musculoskeletal system and connective tissue: Secondary | ICD-10-CM | POA: Insufficient documentation

## 2014-01-14 DIAGNOSIS — Z8639 Personal history of other endocrine, nutritional and metabolic disease: Secondary | ICD-10-CM | POA: Insufficient documentation

## 2014-01-14 DIAGNOSIS — W57XXXA Bitten or stung by nonvenomous insect and other nonvenomous arthropods, initial encounter: Secondary | ICD-10-CM

## 2014-01-14 DIAGNOSIS — Z8669 Personal history of other diseases of the nervous system and sense organs: Secondary | ICD-10-CM | POA: Insufficient documentation

## 2014-01-14 DIAGNOSIS — Z862 Personal history of diseases of the blood and blood-forming organs and certain disorders involving the immune mechanism: Secondary | ICD-10-CM | POA: Insufficient documentation

## 2014-01-14 DIAGNOSIS — Z79899 Other long term (current) drug therapy: Secondary | ICD-10-CM | POA: Insufficient documentation

## 2014-01-14 DIAGNOSIS — Y929 Unspecified place or not applicable: Secondary | ICD-10-CM | POA: Insufficient documentation

## 2014-01-14 DIAGNOSIS — K219 Gastro-esophageal reflux disease without esophagitis: Secondary | ICD-10-CM | POA: Insufficient documentation

## 2014-01-14 DIAGNOSIS — R202 Paresthesia of skin: Secondary | ICD-10-CM

## 2014-01-14 DIAGNOSIS — Z880 Allergy status to penicillin: Secondary | ICD-10-CM | POA: Insufficient documentation

## 2014-01-14 LAB — CBC WITH DIFFERENTIAL/PLATELET
Basophils Absolute: 0 10*3/uL (ref 0.0–0.1)
Basophils Relative: 1 % (ref 0–1)
EOS ABS: 0.4 10*3/uL (ref 0.0–0.7)
Eosinophils Relative: 5 % (ref 0–5)
HCT: 42 % (ref 39.0–52.0)
HEMOGLOBIN: 14.3 g/dL (ref 13.0–17.0)
LYMPHS ABS: 2.6 10*3/uL (ref 0.7–4.0)
Lymphocytes Relative: 32 % (ref 12–46)
MCH: 31.8 pg (ref 26.0–34.0)
MCHC: 34 g/dL (ref 30.0–36.0)
MCV: 93.3 fL (ref 78.0–100.0)
MONOS PCT: 10 % (ref 3–12)
Monocytes Absolute: 0.8 10*3/uL (ref 0.1–1.0)
NEUTROS ABS: 4.2 10*3/uL (ref 1.7–7.7)
Neutrophils Relative %: 52 % (ref 43–77)
Platelets: 164 10*3/uL (ref 150–400)
RBC: 4.5 MIL/uL (ref 4.22–5.81)
RDW: 13.2 % (ref 11.5–15.5)
WBC: 8.1 10*3/uL (ref 4.0–10.5)

## 2014-01-14 LAB — COMPREHENSIVE METABOLIC PANEL
ALT: 30 U/L (ref 0–53)
AST: 23 U/L (ref 0–37)
Albumin: 4.2 g/dL (ref 3.5–5.2)
Alkaline Phosphatase: 65 U/L (ref 39–117)
BILIRUBIN TOTAL: 0.3 mg/dL (ref 0.3–1.2)
BUN: 11 mg/dL (ref 6–23)
CHLORIDE: 101 meq/L (ref 96–112)
CO2: 22 mEq/L (ref 19–32)
Calcium: 9.4 mg/dL (ref 8.4–10.5)
Creatinine, Ser: 1.05 mg/dL (ref 0.50–1.35)
GLUCOSE: 134 mg/dL — AB (ref 70–99)
Potassium: 3.8 mEq/L (ref 3.7–5.3)
Sodium: 141 mEq/L (ref 137–147)
Total Protein: 6.8 g/dL (ref 6.0–8.3)

## 2014-01-14 LAB — CARBOXYHEMOGLOBIN
Carboxyhemoglobin: 9.3 % (ref 0.5–1.5)
Methemoglobin: 0.8 % (ref 0.0–1.5)
O2 Saturation: 90.8 %
Total hemoglobin: 13.5 g/dL (ref 13.5–18.0)

## 2014-01-14 NOTE — ED Notes (Signed)
Pt states 3 weeks ago he was having numbness to his left arm; numbness radiated from his shoulder downward and has gotten worse over the past 3 weeks to where he is having a hard time holding/grabbing things. 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 Pt states that the numbness can sometimes reach his chest and radiate to his neck when really bad. Pt is alert and oriented ambulatory and able to move all extremties.

## 2014-01-15 ENCOUNTER — Emergency Department (HOSPITAL_COMMUNITY): Payer: 59

## 2014-01-15 LAB — I-STAT TROPONIN, ED: TROPONIN I, POC: 0 ng/mL (ref 0.00–0.08)

## 2014-01-15 MED ORDER — DIPHENHYDRAMINE HCL 50 MG/ML IJ SOLN
25.0000 mg | Freq: Once | INTRAMUSCULAR | Status: DC
  Filled 2014-01-15: qty 1

## 2014-01-15 MED ORDER — DOXYCYCLINE HYCLATE 100 MG PO CAPS: 100.0000 mg | ORAL_CAPSULE | Freq: Two times a day (BID) | ORAL | Status: DC

## 2014-01-15 MED ORDER — METOCLOPRAMIDE HCL 5 MG/ML IJ SOLN
10.0000 mg | Freq: Once | INTRAMUSCULAR | Status: DC
  Filled 2014-01-15: qty 2

## 2014-01-15 NOTE — Discharge Instructions (Signed)
Take doxy for 10 days.   Take tylenol, motrin for pain.   Follow up with neurologist if you still have numbness in a week.   Stop smoking.   Return to ER if you have chest pain, shortness of breath, worse numbness or weakness.

## 2014-01-15 NOTE — ED Notes (Signed)
Patient transported to X-ray 

## 2014-01-15 NOTE — ED Notes (Signed)
Pt refusing medications until speaking with MD- Dr. Darl Householder at bedside.

## 2014-01-15 NOTE — ED Provider Notes (Signed)
CSN: [id number]     Arrival date & time 01/14/14  1945 History   First MD Initiated Contact with Patient 01/14/14 2325     Chief Complaint  Patient presents with  . Numbness  . Arm Pain     (Consider location/radiation/quality/duration/timing/severity/associated sxs/prior Treatment) The history is provided by the patient.  Kruze Atchley is a 33 y.o. male hx of GERD, smoker here with L arm numbness. 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 He had a tick bite 3 weeks ago. Since then had intermittent numbness and weakness in left arm. Had some headache that resolved. Intermittent chest pain as well. Denies leg numbness or weakness. Still smokes. Had family hx of CAD.    Past Medical History  Diagnosis Date  . GERD (gastroesophageal reflux disease)   . Shoulder joint pain 08-2011  . Hypoglycemia   . Narcolepsy   . PONV (postoperative nausea and vomiting)   . Sinus headache   . Cough 04/17/2013    to start antibiotic 04/17/2013  . Arthritis     right shoulder  . Rotator cuff tear 03/2013    right  . Anxiety    Past Surgical History  Procedure Laterality Date  . Cyst excision  1998    bladder  . Tympanostomy tube placement      as a child  . Colonoscopy with propofol  07/02/2012    Procedure: COLONOSCOPY WITH PROPOFOL;  Surgeon: Arta Silence, MD;  Location: [facility];  Service: Endoscopy;  Laterality: N/A;  . Shoulder arthroscopy with rotator cuff repair Right 04/20/2013    Procedure: SHOULDER ARTHROSCOPY WITH POSTERIOR ROTATOR CUFF REPAIR and debridement;  Surgeon: Nita Sells, MD;  Location: [place];  Service: Orthopedics;  Laterality: Right;  Right shoulder arthroscopy posterior rotator cuff repair and debridement   Family History  Problem Relation Age of Onset  . Heart disease Father     MI   History  Substance Use Topics  . Smoking status: Current Every Day Smoker -- 1.00 packs/day for 25 years     Types: Cigarettes  . Smokeless tobacco: Never Used  . Alcohol Use: No    Review of Systems  Neurological: Positive for weakness and numbness.  All other systems reviewed and are negative.     Allergies  Adderall; Penicillins; and Septra  Home Medications   Prior to Admission medications   Medication Sig Start Date End Date Taking? Authorizing Provider  Armodafinil (NUVIGIL) 250 MG tablet Take 1 tablet (250 mg total) by mouth daily. Taking 275 mg daily. 09/04/13  Yes Carmen Dohmeier, MD  Armodafinil (NUVIGIL) 50 MG tablet Take 2 tablets (100 mg total) by mouth daily. 09/04/13  Yes Asencion Partridge Dohmeier, MD  esomeprazole (NEXIUM) 40 MG capsule Take 40 mg by mouth daily before breakfast.   Yes Historical Provider, MD  glucose blood test strip Use as instructed 12/08/12   Renato Shin, MD   BP 115/78  Pulse 83  Temp(Src) 98.4 F (36.9 C) (Oral)  Resp 18  Ht 6' (1.829 m)  Wt 224 lb 9 oz (101.861 kg)  BMI 30.45 kg/m2  SpO2 99% Physical Exam  Nursing note and vitals reviewed. Constitutional: He is oriented to person, place, and time. He appears well-developed and well-nourished.  Slightly anxious   HENT:  Head: Normocephalic.  Mouth/Throat: Oropharynx is clear and moist.  Eyes: Conjunctivae and EOM are normal. Pupils are equal, round, and reactive to light.  Neck:  Normal range of motion. Neck supple.  Cardiovascular: Normal rate, regular rhythm and normal heart sounds.   Pulmonary/Chest: Effort normal and breath sounds normal. No respiratory distress. He has no wheezes. He has no rales.  Abdominal: Soft. Bowel sounds are normal. He exhibits no distension. There is no tenderness. There is no rebound and no guarding.  Musculoskeletal: Normal range of motion. He exhibits no edema and no tenderness.  Neurological: He is alert and oriented to person, place, and time.  Slightly dec sensation L arm, nl strength throughout.   Skin: Skin is warm and dry.  No obvious tick bite    Psychiatric: He has a normal mood and affect. His behavior is normal. Judgment and thought content normal.    ED Course  Procedures (including critical care time) Labs Review Labs Reviewed  COMPREHENSIVE METABOLIC PANEL - Abnormal; Notable for the following:    Glucose, Bld 134 (*)    All other components within normal limits  CARBOXYHEMOGLOBIN - Abnormal; Notable for the following:    Carboxyhemoglobin 9.3 (*)    All other components within normal limits  CBC WITH DIFFERENTIAL  Randolm Idol, ED    Imaging Review Dg Chest 2 View  01/15/2014   CLINICAL DATA:  Chest pain.  EXAM: CHEST  2 VIEW  COMPARISON:  None.  FINDINGS: The lungs are well-aerated. Pulmonary vascularity is at the upper limits of normal. There is no evidence of focal opacification, pleural effusion or pneumothorax.  The heart is normal in size; the mediastinal contour is within normal limits. No acute osseous abnormalities are seen.  IMPRESSION: No acute cardiopulmonary process seen.   Electronically Signed   By: Garald Balding M.D.   On: 01/15/2014 00:43     EKG Interpretation   Date/Time:  Thursday January 14 2014 19:55:31 EDT Ventricular Rate:  78 PR Interval:  140 QRS Duration: 106 QT Interval:  372 QTC Calculation: 424 R Axis:   41 Text Interpretation:  Normal sinus rhythm Incomplete right bundle branch  block Borderline ECG No previous ECGs available Confirmed by YAO  MD,  DAVID ([id number]) on 01/14/2014 11:26:25 PM      MDM   Final diagnoses:  None    Deandrae Wajda is a 33 y.o. male here with L arm numbness. Consider atypical migraine vs atypical lyme disease. I doubt ACS and symptoms for several weeks. Trop neg x 1. Also is smoking and has been in tight spaces with fumes. CO level was 9, which is normal consider that he smokes. Recommend smoking cessation. Discussed empiric treatment with doxy vs checking lyme titers with PMD. They want empiric doxy. Will also refer to neuro.     Wandra Arthurs,  MD 01/15/14 [phone]

## 2014-03-11 ENCOUNTER — Other Ambulatory Visit: Payer: Self-pay | Admitting: Neurology

## 2014-03-11 NOTE — Telephone Encounter (Signed)
Rx signed and faxed.

## 2014-04-09 ENCOUNTER — Ambulatory Visit: Payer: 59 | Admitting: Nurse Practitioner

## 2014-04-30 ENCOUNTER — Ambulatory Visit: Payer: 59 | Admitting: Nurse Practitioner

## 2014-04-30 ENCOUNTER — Telehealth: Payer: Self-pay | Admitting: Nurse Practitioner

## 2014-04-30 NOTE — Telephone Encounter (Signed)
No show for scheduled appt 

## 2014-05-07 ENCOUNTER — Encounter: Payer: Self-pay | Admitting: Adult Health

## 2014-05-07 ENCOUNTER — Ambulatory Visit (INDEPENDENT_AMBULATORY_CARE_PROVIDER_SITE_OTHER): Payer: 59 | Admitting: Adult Health

## 2014-05-07 VITALS — Ht 71.0 in | Wt 228.0 lb

## 2014-05-07 DIAGNOSIS — G4710 Hypersomnia, unspecified: Secondary | ICD-10-CM

## 2014-05-07 MED ORDER — ARMODAFINIL 250 MG PO TABS: ORAL_TABLET | ORAL | Status: DC

## 2014-05-07 MED ORDER — ARMODAFINIL 50 MG PO TABS: ORAL_TABLET | ORAL | Status: DC

## 2014-05-07 NOTE — Patient Instructions (Signed)
Hypersomnia Hypersomnia usually brings recurrent episodes of excessive daytime sleepiness or prolonged nighttime sleep. It is different than feeling tired due to lack of or interrupted sleep at night. People with hypersomnia are compelled to nap repeatedly during the day. This is often at inappropriate times such as:  At work.  During a meal.  In conversation. These daytime naps usually provide no relief. This disorder typically affects adolescents and young adults. CAUSES  This condition may be caused by:  Another sleep disorder (such as narcolepsy or sleep apnea).  Dysfunction of the autonomic nervous system.  Drug or alcohol abuse.  A physical problem, such as:  A tumor.  Head trauma. This is damage caused by an accident.  Injury to the central nervous system.  Certain medications, or medicine withdrawal.  Medical conditions may contribute to the disorder, including:  Multiple sclerosis.  Depression.  Encephalitis.  Epilepsy.  Obesity.  Some people appear to have a genetic predisposition to this disorder. In others, there is no known cause. SYMPTOMS   Patients often have difficulty waking from a long sleep. They may feel dazed or confused.  Other symptoms may include:  Anxiety.  Increased irritation (inflammation).  Decreased energy.  Restlessness.  Slow thinking.  Slow speech.  Loss of appetite.  Hallucinations.  Memory difficulty.  Tremors, Tics.  Some patients lose the ability to function in family, social, occupational, or other settings. TREATMENT  Treatment is symptomatic in nature. Stimulants and other drugs may be used to treat this disorder. Changes in behavior may help. For example, avoid night work and social activities that delay bed time. Changes in diet may offer some relief. Patients should avoid alcohol and caffeine. PROGNOSIS  The likely outcome (prognosis) for persons with hypersomnia depends on the cause of the disorder.  The disorder itself is not life threatening. But it can have serious consequences. For example, automobile accidents can be caused by falling asleep while driving. The attacks usually continue indefinitely. Document Released: 07/06/2002 Document Revised: 10/08/2011 Document Reviewed: 06/09/2008 Exit[employer] Patient Information 2015 [employer]. This information is not intended to replace advice given to you by your health care provider. Make sure you discuss any questions you have with your health care provider.  

## 2014-05-07 NOTE — Progress Notes (Signed)
PATIENT: Frank Moyer DOB: [birth year]  REASON FOR VISIT: follow up HISTORY FROM: patient  HISTORY OF PRESENT ILLNESS: Mr. Maher is a 33 year old male with a history of severe hypersomnia. He returns today for follow-up. He is currently taking Nuvgil and tolerating it well. He goes to bed around 10-11 PM and arises at 4:30 AM. He feels that the Nuvigil has improved his daytime sleepiness. He has had a sleep study in the past that showed borderline sleep apnea. Patient states that he has  had the PSG with MSLT- I will have to look back in centricity for this report.  Patient's girlfriend states that he has headaches off and on but they think its relating to his vision. Patient states that sometimes the light bothers his eyes and can then cause a headache. Epworth score 7 and fatigue severity score 11.   REVIEW OF SYSTEMS: Full 14 system review of systems performed and notable only for:  Constitutional: N/A  Eyes: light sensitivity  Ear/Nose/Throat: N/A  Skin: N/A  Cardiovascular: N/A  Respiratory: cough Gastrointestinal: N/A  Genitourinary: N/A Hematology/Lymphatic: N/A  Endocrine: N/A Musculoskeletal:N/A  Allergy/Immunology: N/A  Neurological: headache Psychiatric: N/A Sleep: snoring   ALLERGIES: Allergies  Allergen Reactions  . Adderall [Amphetamine-Dextroamphet Er] Other (See Comments)    MENTAL STATUS CHANGES  . Penicillins Other (See Comments)    UNKNOWN - AS A CHILD  . Septra [Sulfamethoxazole-Tmp Ds] Other (See Comments)    UNKNOWN     HOME MEDICATIONS: Outpatient Prescriptions Prior to Visit  Medication Sig Dispense Refill  . esomeprazole (NEXIUM) 40 MG capsule Take 40 mg by mouth daily before breakfast.      . glucose blood test strip Use as instructed  100 each  12  . NUVIGIL 250 MG tablet TAKE 1 TABLET BY MOUTH EVERY DAY  90 tablet  1  . Armodafinil (NUVIGIL) 50 MG tablet Take 2 tablets (100 mg total) by mouth daily.  45 tablet  3  . doxycycline  (VIBRAMYCIN) 100 MG capsule Take 1 capsule (100 mg total) by mouth 2 (two) times daily.  20 capsule  0   No facility-administered medications prior to visit.    PAST MEDICAL HISTORY: Past Medical History  Diagnosis Date  . GERD (gastroesophageal reflux disease)   . Shoulder joint pain 08-2011  . Hypoglycemia   . Narcolepsy   . PONV (postoperative nausea and vomiting)   . Sinus headache   . Cough 04/17/2013    to start antibiotic 04/17/2013  . Arthritis     right shoulder  . Rotator cuff tear 03/2013    right  . Anxiety     PAST SURGICAL HISTORY: Past Surgical History  Procedure Laterality Date  . Cyst excision  1998    bladder  . Tympanostomy tube placement      as a child  . Colonoscopy with propofol  07/02/2012    Procedure: COLONOSCOPY WITH PROPOFOL;  Surgeon: Arta Silence, MD;  Location: [facility];  Service: Endoscopy;  Laterality: N/A;  . Shoulder arthroscopy with rotator cuff repair Right 04/20/2013    Procedure: SHOULDER ARTHROSCOPY WITH POSTERIOR ROTATOR CUFF REPAIR and debridement;  Surgeon: Nita Sells, MD;  Location: [facility];  Service: Orthopedics;  Laterality: Right;  Right shoulder arthroscopy posterior rotator cuff repair and debridement    FAMILY HISTORY: Family History  Problem Relation Age of Onset  . Heart disease Father     MI    SOCIAL HISTORY: History  Social History  . Marital Status: Single    Spouse Name: N/A    Number of Children: 2  . Years of Education: GED   Occupational History  .     Social History Main Topics  . Smoking status: Current Every Day Smoker -- 1.00 packs/day for 25 years    Types: Cigarettes  . Smokeless tobacco: Never Used  . Alcohol Use: No  . Drug Use: No  . Sexual Activity: Not on file   Other Topics Concern  . Not on file   Social History Narrative   Patient is single and lives with his family.   Patient has two children.   Patient is working full-time on light duty from  shoulder surgery.   Patient is right- handed.   Patient has a GED.      PHYSICAL EXAM  Filed Vitals:   05/07/14 0918  Height: 5\' 11"  (1.803 m)  Weight: 228 lb (103.42 kg)   Body mass index is 31.81 kg/(m^2).  Generalized: Well developed, in no acute distress   Neurological examination  Mentation: Alert oriented to time, place, history taking. Follows all commands speech and language fluent Cranial nerve II-XII: Pupils were equal round reactive to light. Extraocular movements were full, visual field were full on confrontational test. Facial sensation and strength were normal.  Uvula tongue midline. Head turning and shoulder shrug  were normal and symmetric. Motor: The motor testing reveals 5 over 5 strength of all 4 extremities. Good symmetric motor tone is noted throughout.  Sensory: Sensory testing is intact to soft touch on all 4 extremities. No evidence of extinction is noted.  Coordination: Cerebellar testing reveals good finger-nose-finger and heel-to-shin bilaterally.  Gait and station: Gait is normal. Tandem gait is normal. Romberg is negative. No drift is seen.  Reflexes: Deep tendon reflexes are symmetric and normal bilaterally.     DIAGNOSTIC DATA (LABS, IMAGING, TESTING) - I reviewed patient records, labs, notes, testing and imaging myself where available.  Lab Results  Component Value Date   WBC 8.1 01/14/2014   HGB 14.3 01/14/2014   HCT 42.0 01/14/2014   MCV 93.3 01/14/2014   PLT 164 01/14/2014      Component Value Date/Time   NA 141 01/14/2014 2005   K 3.8 01/14/2014 2005   CL 101 01/14/2014 2005   CO2 22 01/14/2014 2005   GLUCOSE 134* 01/14/2014 2005   BUN 11 01/14/2014 2005   CREATININE 1.05 01/14/2014 2005   CALCIUM 9.4 01/14/2014 2005   PROT 6.8 01/14/2014 2005   ALBUMIN 4.2 01/14/2014 2005   AST 23 01/14/2014 2005   ALT 30 01/14/2014 2005   ALKPHOS 65 01/14/2014 2005   BILITOT 0.3 01/14/2014 2005   GFRNONAA >90 01/14/2014 2005   GFRAA >90 01/14/2014 2005        ASSESSMENT AND PLAN 33 y.o. year old male  has a past medical history of GERD (gastroesophageal reflux disease); Shoulder joint pain (08-2011); Hypoglycemia; Narcolepsy; PONV (postoperative nausea and vomiting); Sinus headache; Cough (04/17/2013); Arthritis; Rotator cuff tear (03/2013); and Anxiety. here wit:  1. Hypersomnia  Patient is doing well with the nuvigil. Epworth is 7 and fatigue score is 11. Daytime sleepiness is controlled with Nuvigil.  Continue Nuvigil, I will refill today. Follow-up with ophthalmologist regarding light sensitivity.  If symptoms worsen or if he develops new symptoms then he should let us know.  Follow-up in 6 months or sooner if needed.   Ward Givens, MSN, NP-C 05/07/2014, 9:21 AM [facility]  [address], [place], [place] [address] [phone]  Note: This document was prepared with digital dictation and possible smart phrase technology. Any transcriptional errors that result from this process are unintentional.

## 2014-05-07 NOTE — Progress Notes (Signed)
I agree with the assessment and plan as directed by NP .The patient is known to me .   Ashlen Kiger, MD  

## 2014-06-04 ENCOUNTER — Telehealth: Payer: Self-pay | Admitting: Neurology

## 2014-06-04 DIAGNOSIS — G4730 Sleep apnea, unspecified: Principal | ICD-10-CM

## 2014-06-04 DIAGNOSIS — G4710 Hypersomnia, unspecified: Secondary | ICD-10-CM

## 2014-06-04 MED ORDER — AMPHETAMINE-DEXTROAMPHETAMINE 10 MG PO TABS: 10.0000 mg | ORAL_TABLET | Freq: Every day | ORAL | Status: DC

## 2014-06-04 NOTE — Telephone Encounter (Signed)
When I saw the patient in October his daytime sleepiness was controlled with Nuvigil. Since he had a reaction before while taking adderall, I will let Dr. Brett Fairy decide if she wants to start adderall.

## 2014-06-04 NOTE — Telephone Encounter (Signed)
Patient's wife calling on behalf of patient to request a script for Adderall, please return call and advise.

## 2014-06-04 NOTE — Telephone Encounter (Signed)
Frank Moyer, can you let the patient know that Dr. Brett Fairy prescribed Adderall 10 mg daily with breakfast. Thanks!

## 2014-06-04 NOTE — Telephone Encounter (Signed)
Patient is requesting a Rx for Adderall.  I called back because we have Adderall listed as an allergy.  Spoke with patient.  He said a long time ago he took Adderall with an anti-depressant, and felt the combination of meds caused altered mental status, however, feels if he takes Adderall alone, it is beneficial.  Would you like to prescribe?  Please advise.  Thank you.

## 2014-06-07 ENCOUNTER — Telehealth: Payer: Self-pay | Admitting: *Deleted

## 2014-06-07 NOTE — Telephone Encounter (Signed)
I called back today (Monday).  Got no answer.  Left message advising Rx will be written and we will call when it's ready for pick up.

## 2014-06-07 NOTE — Telephone Encounter (Signed)
I called the patient to let them know their Rx for Adderall was ready for pickup. Patient was instructed to bring Photo ID. 

## 2014-06-07 NOTE — Addendum Note (Signed)
Addended by: Simonne Maffucci on: 06/07/2014 04:10 PM   Modules accepted: Medications

## 2014-07-29 NOTE — Telephone Encounter (Signed)
error 

## 2014-10-22 ENCOUNTER — Ambulatory Visit: Payer: Self-pay | Admitting: Neurology

## 2014-11-12 ENCOUNTER — Ambulatory Visit: Payer: 59 | Admitting: Neurology

## 2014-12-04 ENCOUNTER — Other Ambulatory Visit: Payer: Self-pay | Admitting: Neurology

## 2014-12-04 ENCOUNTER — Other Ambulatory Visit: Payer: Self-pay | Admitting: Adult Health

## 2014-12-06 ENCOUNTER — Other Ambulatory Visit: Payer: Self-pay

## 2014-12-06 ENCOUNTER — Telehealth: Payer: Self-pay | Admitting: Neurology

## 2014-12-06 MED ORDER — ARMODAFINIL 250 MG PO TABS: ORAL_TABLET | ORAL | Status: DC

## 2014-12-06 NOTE — Telephone Encounter (Signed)
Duplicate request

## 2014-12-06 NOTE — Telephone Encounter (Signed)
Hala from [facility] called and requested a verbal request for Rx. NUVIGIL for the patient. Please call and advise.

## 2014-12-06 NOTE — Telephone Encounter (Signed)
Patients girlfriend called and stated that the patients Rx. NUVIGIL is about to run out and is wanting to check the status of the refill. She is very upset and would like to speak with someone as soon as possible.Please call and advise.

## 2014-12-06 NOTE — Telephone Encounter (Signed)
I called back. Spoke with [employer].  She was under the impression they had to come to the office to pick up written Rx.  Advised although it is a controlled substance, the class of med it is can be signed and faxed, no need for them to come to the office for Rx.  She is aware Rx will be sent once it has been signed.  She verbalized understanding and said nothing further is needed at this time.

## 2014-12-06 NOTE — Telephone Encounter (Signed)
The request for this Rx was forwarded to provider for approval.

## 2014-12-06 NOTE — Telephone Encounter (Signed)
I called the pharmacy and spoke with pharmacist who verified they do have the Rx, and are currently still processing this order.  I called the patient back at home, got no answer.  Called cell.  Spoke with patient,.  He is aware.

## 2014-12-07 ENCOUNTER — Telehealth: Payer: Self-pay

## 2014-12-07 ENCOUNTER — Other Ambulatory Visit: Payer: Self-pay | Admitting: Neurology

## 2014-12-07 NOTE — Telephone Encounter (Signed)
Pt called office saying that when he went to pick up Nuvigil yesterday, he was not given the 50 mg tabs and he needs those because he cuts them in half. Alerted Jessica, pharmtech, and she said she would take care of it. Asked her to call pt back when it is ready.

## 2014-12-07 NOTE — Telephone Encounter (Signed)
I called the pharmacy and spoke with pharmacist, Judson Roch.  She verified they will fill Nuvigil 50 mg tabs in addition to the 250 mg tabs.  Since the patient takes the same med with 2 different strengths, it appeared to be a duplicate therapy.  I called the patient back at home, got no answer.  Called cell. Spoke with patient.  Explained the pharmacy will fill second Rx.  Apologized for any inconvenience this may have caused.  Patient was very pleasant, said it was okay, he understood.  He will follow up with the pharmacy and call us back if anything further is needed.

## 2014-12-07 NOTE — Telephone Encounter (Signed)
Patient is taking two different strengths of Nuvigil

## 2014-12-24 ENCOUNTER — Ambulatory Visit (INDEPENDENT_AMBULATORY_CARE_PROVIDER_SITE_OTHER): Payer: 59 | Admitting: Neurology

## 2014-12-24 ENCOUNTER — Encounter: Payer: Self-pay | Admitting: Neurology

## 2014-12-24 VITALS — BP 144/84 | HR 80 | Resp 20 | Ht 71.0 in | Wt 227.0 lb

## 2014-12-24 DIAGNOSIS — G4730 Sleep apnea, unspecified: Secondary | ICD-10-CM | POA: Diagnosis not present

## 2014-12-24 DIAGNOSIS — G4726 Circadian rhythm sleep disorder, shift work type: Secondary | ICD-10-CM | POA: Diagnosis not present

## 2014-12-24 DIAGNOSIS — G4710 Hypersomnia, unspecified: Secondary | ICD-10-CM | POA: Diagnosis not present

## 2014-12-24 MED ORDER — ARMODAFINIL 250 MG PO TABS: ORAL_TABLET | ORAL | Status: DC

## 2014-12-24 MED ORDER — ARMODAFINIL 50 MG PO TABS: 25.0000 mg | ORAL_TABLET | Freq: Every day | ORAL | Status: DC

## 2014-12-24 NOTE — Progress Notes (Signed)
PATIENT: Frank Moyer DOB: 19-Dec-1980  REASON FOR VISIT: follow up HISTORY FROM: patient  HISTORY OF PRESENT ILLNESS: Frank Moyer is a 34 year old male with a history of severe hypersomnia. He returns today for follow-up. He is currently taking Nuvgil and tolerating it well. He goes to bed around 10-11 PM and arises at 4:30 AM. He feels that the Nuvigil has improved his daytime sleepiness.  He has had a sleep study in the past that showed borderline sleep apnea.  Patient states that he has  had the PSG with MSLT- I will have to look back in centricity for this report.  Patient's girlfriend states that he has headaches off and on but they think its relating to his vision. Patient states that sometimes the light bothers his eyes and can then cause a headache. Epworth score 7 and fatigue severity score 11.   CD visit 5-20 7-16. Frank Moyer is here for a yearly revisit he receives Nuvigil from our office. He endorses on medication an Epworth sleepiness score of 6 points the fatigue severity score of 17 points. His current sleep habits are as follows; he goes to bed between 10 and 11 usually falls asleep promptly he has to rise already at 4 AM for his job. He works from 6 AM to [place] with the city of [place]. He has been able to control his hypersomnia with medication.  He has seen an ' eye doctor' who told him his photosensitivity was not abnormal. He still has headaches, usually in PM, not mornings. He believes this is allergy/sinus related. He noted no nausea.    REVIEW OF SYSTEMS: Full 14 system review of systems performed and notable only for:   Eyes: light sensitivity  Neurological: headache Sleep: snoring   ALLERGIES: Allergies  Allergen Reactions  . Adderall [Amphetamine-Dextroamphet Er] Other (See Comments)    MENTAL STATUS CHANGES  . Penicillins Other (See Comments)    UNKNOWN - AS A CHILD  . Septra [Sulfamethoxazole-Trimethoprim] Other (See Comments)    UNKNOWN      HOME MEDICATIONS: Outpatient Prescriptions Prior to Visit  Medication Sig Dispense Refill  . amphetamine-dextroamphetamine (ADDERALL) 10 MG tablet Take 1 tablet (10 mg total) by mouth daily with breakfast. 30 tablet 0  . Armodafinil (NUVIGIL) 250 MG tablet TAKE 1 TABLET BY MOUTH EVERY DAY 90 tablet 1  . esomeprazole (NEXIUM) 40 MG capsule Take 40 mg by mouth daily before breakfast.    . glucose blood test strip Use as instructed 100 each 12  . NUVIGIL 50 MG tablet TAKE 1/2 TABLET BY MOUTH DAILY 45 tablet 1  . azithromycin (ZITHROMAX) 500 MG tablet   0   No facility-administered medications prior to visit.    PAST MEDICAL HISTORY: Past Medical History  Diagnosis Date  . GERD (gastroesophageal reflux disease)   . Shoulder joint pain 08-2011  . Hypoglycemia   . Narcolepsy   . PONV (postoperative nausea and vomiting)   . Sinus headache   . Cough 04/17/2013    to start antibiotic 04/17/2013  . Arthritis     right shoulder  . Rotator cuff tear 03/2013    right  . Anxiety     PAST SURGICAL HISTORY: Past Surgical History  Procedure Laterality Date  . Cyst excision  1998    bladder  . Tympanostomy tube placement      as a child  . Colonoscopy with propofol  07/02/2012    Procedure: COLONOSCOPY WITH PROPOFOL;  Surgeon: Gwyndolyn Saxon  Paulita Fujita, MD;  Location: [facility];  Service: Endoscopy;  Laterality: N/A;  . Shoulder arthroscopy with rotator cuff repair Right 04/20/2013    Procedure: SHOULDER ARTHROSCOPY WITH POSTERIOR ROTATOR CUFF REPAIR and debridement;  Surgeon: Nita Sells, MD;  Location[facility];  Service: Orthopedics;  Laterality: Right;  Right shoulder arthroscopy posterior rotator cuff repair and debridement    FAMILY HISTORY: Family History  Problem Relation Age of Onset  . Heart disease Father     MI    SOCIAL HISTORY: History   Social History  . Marital Status: Single    Spouse Name: N/A  . Number of Children: 2  . Years of  Education: GED   Occupational History  .     Social History Main Topics  . Smoking status: Current Every Day Smoker -- 1.00 packs/day for 25 years    Types: Cigarettes  . Smokeless tobacco: Never Used  . Alcohol Use: No  . Drug Use: No  . Sexual Activity: Not on file   Other Topics Concern  . Not on file   Social History Narrative   Patient is single and lives with his family.   Patient has two children.   Patient is working full-time on light duty from shoulder surgery.   Patient is right- handed.   Patient has a GED.      Drinks one cup of caffeine on Saturdays.      PHYSICAL EXAM  Filed Vitals:   12/24/14 1114  BP: 144/84  Pulse: 80  Resp: 20  Height: 5\' 11"  (1.803 m)  Weight: 227 lb (102.967 kg)   Body mass index is 31.67 kg/(m^2).  Generalized: Well developed, in no acute distress   Neurological examination  Mentation: Alert oriented to time, place, history taking. Follows all commands speech and language fluent Cranial nerve Pupils were equal round reactive to light. Extraocular movements were full,  visual field were full on confrontational test. Facial sensation and strength were normal.  Uvula tongue midline.  Head turning and shoulder shrug  were normal and symmetric. Motor: The motor testing reveals 5 out of  5 strength of all 4 extremities, symmetric motor tone and muscle bulk  is noted throughout.  Sensory: Sensory testing is intact to soft touch on all 4 extremities. No evidence of extinction is noted.  Coordination: Cerebellar testing reveals good finger-nose-finger and heel-to-shin bilaterally.  Gait and station: Gait is normal. Tandem gait is normal. Romberg is negative. No drift is seen.  Reflexes: Deep tendon reflexes are symmetric  bilaterally. Babinski downgoing.     DIAGNOSTIC DATA (LABS, IMAGING, TESTING) - I reviewed patient records, labs, notes, testing and imaging myself where available.  Lab Results  Component Value Date   WBC 8.1  01/14/2014   HGB 14.3 01/14/2014   HCT 42.0 01/14/2014   MCV 93.3 01/14/2014   PLT 164 01/14/2014      Component Value Date/Time   NA 141 01/14/2014 2005   K 3.8 01/14/2014 2005   CL 101 01/14/2014 2005   CO2 22 01/14/2014 2005   GLUCOSE 134* 01/14/2014 2005   BUN 11 01/14/2014 2005   CREATININE 1.05 01/14/2014 2005   CALCIUM 9.4 01/14/2014 2005   PROT 6.8 01/14/2014 2005   ALBUMIN 4.2 01/14/2014 2005   AST 23 01/14/2014 2005   ALT 30 01/14/2014 2005   ALKPHOS 65 01/14/2014 2005   BILITOT 0.3 01/14/2014 2005   GFRNONAA >90 01/14/2014 2005   GFRAA >90 01/14/2014 2005  ASSESSMENT AND PLAN 34 y.o. year old male  has a past medical history of GERD (gastroesophageal reflux disease); Shoulder joint pain (08-2011); Hypoglycemia; Narcolepsy; PONV (postoperative nausea and vomiting); Sinus headache; Cough (04/17/2013); Arthritis; Rotator cuff tear (03/2013); and Anxiety. here wit:  1. Hypersomnia  Patient is doing well with the nuvigil. Epworth is 6 and fatigue score is 17.  Daytime sleepiness is controlled with Nuvigil.  Continue Nuvigil, I will refill today for 6 month .    If symptoms worsen or if he develops new symptoms then he should let us know Genola Yuille , MD     12/24/2014, 11:27 AM [facility] [address] [place], [place] [address] [phone]

## 2015-02-25 ENCOUNTER — Telehealth: Payer: Self-pay | Admitting: Adult Health

## 2015-02-25 ENCOUNTER — Telehealth: Payer: Self-pay | Admitting: Neurology

## 2015-02-25 DIAGNOSIS — G4710 Hypersomnia, unspecified: Secondary | ICD-10-CM

## 2015-02-25 NOTE — Telephone Encounter (Signed)
Insurance is refusing to pay for brand medication nuvigil 250mg  and 50mg . Patient doesn't do as well on generic.

## 2015-02-25 NOTE — Telephone Encounter (Signed)
Ins has been contacted and provided with clinical info, asking if they can grant an exception and cover this medication.  The requests are currently under review.  I called back Got no answer.  Left message.

## 2015-02-25 NOTE — Telephone Encounter (Signed)
I have spoken with [facility], who indicated these drugs are now excluded from the current policy.  A message regarding this was sent to provider.  (Please see previous encounter)

## 2015-02-25 NOTE — Telephone Encounter (Signed)
I spoke with Marshella from ins, she said the patient's plan has just changed, and they will not cover Nuvigil or generic Armodafinil.  She says these drugs are specifically listed as a "plan exclusion", with no exceptions allowed.  The alternatives listed are Modafinil (must show trial and failure of this first) and Provigil.  Would you like to change to Modafinil?  Please advise.  Thank you.   (Also patient is requesting samples of Nuvigil if we have any)

## 2015-02-25 NOTE — Telephone Encounter (Signed)
Armodafinil (NUVIGIL) 250 MG tablet , Armodafinil (NUVIGIL) 50 MG tablet - [facility] called and states they need prior Auth on these [phone] [employer] prior Auth. Medication no longer included in there plan.

## 2015-02-25 NOTE — Telephone Encounter (Signed)
Patient's fiance Eloise Levels) returned your call. She is inquiring if she could get samples if he runs out by Monday. She can be reached [phone].

## 2015-02-25 NOTE — Telephone Encounter (Signed)
I spoke with Marshella from ins, she said the patient's plan has just changed, and they will not cover Nuvigil or generic Armodafinil. She says these drugs are specifically listed as a "plan exclusion", with no exceptions allowed. The alternatives listed are Modafinil (must show trial and failure of this first) and Provigil. Would you like to change to Modafinil? Please advise. Thank you. CMA has kindly placed samples at the front desk for the patient.  I called back, and they are aware the office closes at 12 today.  They will either be in today or Monday.  I relayed info provided by insurance.  They expressed understanding, and are aware message was sent to provider regarding med change.

## 2015-02-28 ENCOUNTER — Telehealth: Payer: Self-pay | Admitting: Neurology

## 2015-02-28 ENCOUNTER — Telehealth: Payer: Self-pay

## 2015-02-28 ENCOUNTER — Encounter: Payer: Self-pay | Admitting: Neurology

## 2015-02-28 MED ORDER — MODAFINIL 200 MG PO TABS: 200.0000 mg | ORAL_TABLET | Freq: Every day | ORAL | Status: DC

## 2015-02-28 NOTE — Telephone Encounter (Signed)
The plan indicates they will allow coverage for Modafinil but not Armodafinil or Nuvigil.  Patient would like to try Modafinil if permissible.

## 2015-02-28 NOTE — Telephone Encounter (Signed)
Please call Frank Moyer [phone], she would like to speak with you concerning several things

## 2015-02-28 NOTE — Telephone Encounter (Signed)
I called Ms Frank Moyer back again.   We spoke at great length.  She said they will continue taking the Nuvigil they have on hand, and try to start the Provigil (Modafinl) this weekend to see how he tolerates this med.  I provided her with the phone number for [facility] [phone] in the event the Modafinil is not effective for the patient.  She will call us back if anything further is needed.

## 2015-02-28 NOTE — Telephone Encounter (Signed)
I called the pharmacy back.  They are aware we did contact ins, and tried to get auth for coverage on Nuvigil, however, this drug (as well as the generic form of this drug, Armodafinil) is excluded from the patient's current plan.  They will note patient's file.  Ins indicates they will, however, cover Modafinil if this drug change is permissible by provider.  Message has been sent.

## 2015-02-28 NOTE — Telephone Encounter (Signed)
I called ins again at [phone].  Spoke with [employer].  Relayed info provider has written in letter of med Delma Post.  She stated "A prior auth cannot be preformed on Nuvigil or Armodafinil, as these medications are a plan exclusion for this member, therefore no coverage criteria can be reviewed or applied to an initial request or appeal.  Unfortunately, exceptions cannot be made to excluded drugs."  She verified Modafinil is covered under the plan with no prior auth needed for a maximum of 1 tablet daily.  Any prescriptions exceeding this quantity would require a prior auth.   I called Ms. Owens Shark.  Got no answer.  Left message.

## 2015-02-28 NOTE — Telephone Encounter (Signed)
Spoke to Dr. Brett Fairy. She is ok with the pt coming in on Friday in December. Made an appt for pt on 12/2 at 11:00. Pt's fiance informed to have pt there by 10:45. She verbalized understanding.

## 2015-02-28 NOTE — Telephone Encounter (Signed)
Patient's fiance called stating he can only come for appointment on Fridays. Since Megan and Dr Dohmenier are not scheduling for Friday who does this pt see? This is for 7 mth f/u. Please call and advise. She can be reached at [phone].

## 2015-02-28 NOTE — Telephone Encounter (Signed)
Janett Billow, this patient's friend called back and asked if the message you left is based on Dr. Edwena Felty discussion today with her on 8/1 @2 :00(See message 7/29).  She frantically again stated he can only take Nuvigil and has to have this medication. Please review and call back and talk with her in regard to this.  Thanks!

## 2015-02-28 NOTE — Telephone Encounter (Signed)
To whom it may concern,  My patient, Frank Moyer, date of birth [birth year] is diagnosed with severe and excessive daytime hypersomnia.  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  This has been the only medication that has worked for him and he has failed the generic form of Provigil before he was started on Nuvigil. He had also tried lower daily doses and failed .   I would like for you to consider allowing him the exception from his current health plan and cover Nuvigil at 275 mg a day.  He uses Nuvigil 250 mg a day and a half of 50 mg daily this has been the only dose working for him and allowing him to have a productive and safe work environment.  Please preauthorize the Nuvigil medication for these eminent clinical reasons,   Sincerely,  Dr. Asencion Partridge Tevita Gomer M.D.   Board certified in [place] and Neurology [employer], [place].

## 2015-02-28 NOTE — Telephone Encounter (Signed)
Certainly do cover generic modafinil? In that case the scope was a generic modafinil and hope that he can respond to it well.

## 2015-02-28 NOTE — Telephone Encounter (Signed)
Noted. Please let us know if we can help you, Janett Billow.

## 2015-02-28 NOTE — Telephone Encounter (Signed)
His choice of plan does not cover the medications he needs ?  Cannot change that , He may want to change providers.

## 2015-02-28 NOTE — Telephone Encounter (Signed)
[employer] ([employer]) has approved the request for a Quantity Limit Exception on Modafinil (allowing greater than one tablet daily) effective until 02/28/2016, or until the policy changes or is terminated.  Ref # [id number].  Ins states they have notified the patient, I have notified the pharmacy.

## 2015-02-28 NOTE — Telephone Encounter (Signed)
[place] [facility] [phone] called to give phone number to override [phone] for approval for [facility] ID# [id number]

## 2015-03-01 ENCOUNTER — Telehealth: Payer: Self-pay | Admitting: Neurology

## 2015-03-01 NOTE — Telephone Encounter (Signed)
Spoke to Ms. Frank Moyer. Advised pt that I have yet to received the appeals fax for Nuvigil from her insurance company. However, Frank Moyer is on the phone currently with pt's insurance trying to get the appeals form. I explained to pt that when Frank Moyer has an update, she will call Frank Moyer. I also gave Ms. Frank Moyer the information for the Nuvigil patient assistance program at [phone]. Ms. Frank Moyer asked me to leave in in her voicemail message. I complied with this request and left this information in a voicemail.

## 2015-03-01 NOTE — Telephone Encounter (Signed)
Ms Frank Moyer said she spoke with ins and they would fax [place] an appeal form to fill out completley and return.  I asked what number she called so we could call the same number.  She did not have the phone number.  Frank Moyer it would be on the back of ins card.  I looked at the card scanned in the system and retrieved the number for member services of [phone].  I called this number.  Spoke with Frank Moyer.  She was not able to assist me and asked that I call [phone].  I called this number.  Spoke with [name].  She was also not able to assist me.  I then was transferred to Frank Moyer.  She said there was no appeal fax sent.  States they so not send appeal faxes for excluded drugs, only for non-formulary drugs that are denied.  Says we can send an appeal letter if desired, however they must have written consent from the patient-with a signature- saying the patient authorizes [place] to send this on their behalf.  States this written consent from patient along with any clinical info we'd like to include may be faxed to [phone].  They may take 3 business days to review this request, in some cases it may take up to 7 days.  However, we will mark urgent, as instructed by agent, asking for the 3 business day turn around time.  I called Ms Frank Moyer back to relay info I was provided by ins.  She expressed understanding and said they will fax [place] the letter of consent requested by ins sometime this evening after we close.  She is aware we will forward this to ins tomorrow when the office reopens along with all clinical info.  Explained it may take them 72 hours (3 business days) to review this request after it has been received by them.  She expressed understanding.

## 2015-03-01 NOTE — Telephone Encounter (Signed)
Spoke to pt's fiance. She is asking if we received a fax regarding the appeals process for the Nuvigil with pt's insurance. I explained to the pt that as of now, I had not received that fax, but it could have gone to [place], our pharmacy tech. She is asking for Janett Billow to call her. I also explained that Janett Billow will (and may already have) contacted the pt's insurance for a lower tier exception for the modafinil. Pt's fiance is asking again for Janett Billow to call her.

## 2015-03-01 NOTE — Telephone Encounter (Signed)
Frank Moyer friend of patient is calling back in regard to problem with drugs. She states that an appeal process has been faxed in regard to [facility] and should be faxed back to number on form attn: "Appeals". Also she stated a call can be made to [phone] for a lower tear reduction form to get cheaper payment for modafinl.  Please call [name]. Thanks!

## 2015-03-01 NOTE — Telephone Encounter (Signed)
I called the pharmacy.  They indicate the co-pay for this drug is $40.   I contacted ins again at [phone].  Due to high call volume, I was on hold for over 30 minutes.  Spoke with Legrand Como.  He has entered a request asking for a tier exception.  This is currently under review.  Ref # [id number].  States it may take 24-72 hours for them to make a determination.  I again asked about the Nuvigil.  He stated this drug is not covered under the current plan, and an appeal is not necessary, as coverage criteria cannot be applied or reviewed for this drug.  Says if an appeal request were sent, it would likely be declined because the drug is completley excluded from the patient's ins plan.  I called Ms Owens Shark back.  Relayed info provided by ins.  She expressed understanding.  Says the patient is still taking Nuvigil samples at this time.  Although he has not yet tried the Modafinil that was prescribed, they feel certain it will not work.  Says she wants me to call ins back again at the member services number listed on the back of the card and ask if they will send an appeal form for Nuvigil.  Advised I will certainly contact them again, but the decision is ultimately up to ins.  She expressed understanding.  (I have also provided info for [facility] Patient Assistance, [phone].  Explained they may contact the program to see if they qualify).

## 2015-03-02 NOTE — Telephone Encounter (Addendum)
Received letter of auth from patient for [place] to send appeal on their behalf.  This, along with letter written by provider and clinical notes have been faxed to [facility] at [phone], marked Urgent.  We have confirmation they received fax at 7:33am.  I faxed all info a second time as well, just to ensure they have it.  Confirmation second fax was received at 7:38am.     I called Ms. Brown to advise.  Got no answer.  Left message relaying we did receive their letter, and all info was forwarded to ins appeals dept.  Left fax confirmation times in message as well.  Also reiterated we may not hear back from ins for 72 hours, per previous conversation with them.

## 2015-03-14 NOTE — Telephone Encounter (Signed)
I called ins.  Spoke with Frank Moyer.  Says the appeal request was denied, indicating the prescription drug (Nuvigil/Armodafinil) is excluded from the plan, and exceptions cannot be made.  He said they have mailed all info to the patient on 03/03/15 at the address they have on file, in [place], [place].  For privacy reasons, he could not give me what address they have for the patient.  Says the patient would need to contact them if the address needed to be updated.  I called Ms. Brown back.  Got no answer.  Left message relaying info provided by ins.

## 2015-03-14 NOTE — Telephone Encounter (Signed)
Patient's fiance called to touch base regarding appeal. Please call and confirm. She can be reached [phone].

## 2015-04-07 ENCOUNTER — Telehealth: Payer: Self-pay | Admitting: Neurology

## 2015-04-07 NOTE — Telephone Encounter (Signed)
Patients fiancee called stating she cannot afford a $40 copay and there was to be a form for a lower tier reduction on modafinil (PROVIGIL) 200 MG tablet sent to pharmacy. She is a little confused on how the program works. [employer] company has not received anything to this affect. Please call and advise. Patient can be reached at [phone].

## 2015-04-07 NOTE — Telephone Encounter (Signed)
I called ins to follow up regarding co-pay reduction.  Spoke with [employer].  She stated "Tier cost reduction (copay override) is not available per the patient's plan benefit".  States a letter regarding this was mailed to the patient at the address they have on file, which is in [place].  Ref # [id number].  I called back and spoke with Ms Owens Shark at great length.  She understands the ins will not reduce co-pay.  Says he seems to be doing well on Modafinil thus far.  States $40 co-pay will become a burden for them.  I again encouraged her to reach out to the assistance programs to determine if they are eligible.  She stated she does not have their contact info, so I have provided it again.  [employer] (Nuvigil Patient Assist) Phone: [phone]  [facility] (Provigil Assist) Phone: [phone]  As well, I downloaded and printed the blank forms for each program and faxed them to her at [phone].  Explained proof of income is required, as well as a copy of photo ID (because drug is a controlled substance).  Provigil Assist is a discounted rate, so relayed they will need to verify the cost of this med with the program.  Nuvigil would be no charge, provided they meet criteria.  She expressed understanding, and says they will review the info and return applicable enrollment form if they qualify.

## 2015-04-08 NOTE — Telephone Encounter (Signed)
The enrollment/order form is combined on the same page.  I called back.  Got no answer.  Left message.

## 2015-04-08 NOTE — Telephone Encounter (Signed)
Frank Moyer [phone] called to advise that the form that was faxed is not the correct form it looks like the order form and not for discount for Provigil. [name] would like a call back.

## 2015-04-19 DIAGNOSIS — G4710 Hypersomnia, unspecified: Secondary | ICD-10-CM

## 2015-04-19 MED ORDER — MODAFINIL 200 MG PO TABS: 200.0000 mg | ORAL_TABLET | Freq: Every day | ORAL | Status: DC

## 2015-04-19 NOTE — Telephone Encounter (Signed)
[name] is calling and said she will be faxing pt photo id . That we need to fax it to Rx outreach along with a cover letter with our information, Dr. Name ectr. , as well as a prescription for provigil ( generic) for a 90 Day supply with 3 refills.  Please call and advise [phone], [name] would like a call back either way. [phone] # for [facility].

## 2015-04-19 NOTE — Telephone Encounter (Signed)
Called [name] back to tell her that I faxed the needed documents to [employer] and received a confirmation fax.  No answer, left a message asking her to call me back.

## 2015-04-19 NOTE — Telephone Encounter (Signed)
I advised pt's fiance that I faxed the needed documents to [employer]. She verbalized thanks. She reports that the pt is doing well on the modafinil, even better than when he was on the nuvigil. He is less like a "zombie" and more like himself. She states that they were using [facility] on [address] to fill the modafinil. I will call the [facility] and advise them that the pt is getting his modafinil from a mail order pharmacy and to hold the refills in case he has problems with the mail order.

## 2015-06-21 ENCOUNTER — Telehealth: Payer: Self-pay

## 2015-06-21 NOTE — Telephone Encounter (Signed)
Dr. Brett Fairy is going to be out of the office 12/2 after 9:00. I spoke to Dr. Brett Fairy and advised her of this appt at 11:00. She asked me to move his appt to 8:00.   I spoke to pt's fiance. She said it would be ok for this appt to be changed to 8:00. I advised pt's fiance that they would need to be here at 7:45. Pt's fiance verbalized understanding. Appt changed to 8:00.

## 2015-07-01 ENCOUNTER — Ambulatory Visit (INDEPENDENT_AMBULATORY_CARE_PROVIDER_SITE_OTHER): Payer: 59 | Admitting: Neurology

## 2015-07-01 ENCOUNTER — Encounter: Payer: Self-pay | Admitting: Neurology

## 2015-07-01 VITALS — BP 120/80 | HR 74 | Ht 71.0 in | Wt 227.5 lb

## 2015-07-01 DIAGNOSIS — G4720 Circadian rhythm sleep disorder, unspecified type: Secondary | ICD-10-CM

## 2015-07-01 DIAGNOSIS — G4710 Hypersomnia, unspecified: Secondary | ICD-10-CM

## 2015-07-01 MED ORDER — MODAFINIL 200 MG PO TABS: 200.0000 mg | ORAL_TABLET | Freq: Every day | ORAL | Status: DC

## 2015-07-01 NOTE — Patient Instructions (Signed)
Modafinil tablets What is this medicine? MODAFINIL (moe DAF i nil) is used to treat excessive sleepiness caused by certain sleep disorders. This includes narcolepsy, sleep apnea, and shift work sleep disorder. This medicine may be used for other purposes; ask your health care provider or pharmacist if you have questions. What should I tell my health care provider before I take this medicine? They need to know if you have any of these conditions: -history of depression, mania, or other mental disorder -kidney disease -liver disease -an unusual or allergic reaction to modafinil, other medicines, foods, dyes, or preservatives -pregnant or trying to get pregnant -breast-feeding How should I use this medicine? Take this medicine by mouth with a glass of water. Follow the directions on the prescription label. Take your doses at regular intervals. Do not take your medicine more often than directed. Do not stop taking except on your doctor's advice. A special MedGuide will be given to you by the pharmacist with each prescription and refill. Be sure to read this information carefully each time. Talk to your pediatrician regarding the use of this medicine in children. This medicine is not approved for use in children. Overdosage: If you think you have taken too much of this medicine contact a poison control center or emergency room at once. NOTE: This medicine is only for you. Do not share this medicine with others. What if I miss a dose? If you miss a dose, take it as soon as you can. If it is almost time for your next dose, take only that dose. Do not take double or extra doses. What may interact with this medicine? Do not take this medicine with any of the following medications: -amphetamine or dextroamphetamine -dexmethylphenidate or methylphenidate -medicines called MAO Inhibitors like Nardil, Parnate, Marplan, Eldepryl -pemoline -procarbazine This medicine may also interact with the following  medications: -antifungal medicines like itraconazole or ketoconazole -barbiturates like phenobarbital -birth control pills or other hormone-containing birth control devices or implants -carbamazepine -cyclosporine -diazepam -medicines for depression, anxiety, or psychotic disturbances -phenytoin -propranolol -triazolam -warfarin This list may not describe all possible interactions. Give your health care provider a list of all the medicines, herbs, non-prescription drugs, or dietary supplements you use. Also tell them if you smoke, drink alcohol, or use illegal drugs. Some items may interact with your medicine. What should I watch for while using this medicine? Visit your doctor or health care professional for regular checks on your progress. The full effects of this medicine may not be seen right away. This medicine may affect your concentration, function, or may hide signs that you are tired. You may get dizzy. Do not drive, use machinery, or do anything that needs mental alertness until you know how this drug affects you. Alcohol can make you more dizzy and may interfere with your response to this medicine or your alertness. Avoid alcoholic drinks. Birth control pills may not work properly while you are taking this medicine. Talk to your doctor about using an extra method of birth control. It is unknown if the effects of this medicine will be increased by the use of caffeine. Caffeine is available in many foods, beverages, and medications. Ask your doctor if you should limit or change your intake of caffeine-containing products while on this medicine. What side effects may I notice from receiving this medicine? Side effects that you should report to your doctor or health care professional as soon as possible: -allergic reactions like skin rash, itching or hives, swelling of the face,   lips, or tongue -anxiety -breathing problems -chest pain -fast, irregular  heartbeat -hallucinations -increased blood pressure -redness, blistering, peeling or loosening of the skin, including inside the mouth -sore throat, fever, or chills -suicidal thoughts or other mood changes -tremors -vomiting Side effects that usually do not require medical attention (report to your doctor or health care professional if they continue or are bothersome): -headache -nausea, diarrhea, or stomach upset -nervousness -trouble sleeping This list may not describe all possible side effects. Call your doctor for medical advice about side effects. You may report side effects to FDA at 1-800-FDA-1088. Where should I keep my medicine? Keep out of the reach of children. This medicine can be abused. Keep your medicine in a safe place to protect it from theft. Do not share this medicine with anyone. Selling or giving away this medicine is dangerous and against the law. This medicine may cause accidental overdose and death if taken by other adults, children, or pets. Mix any unused medicine with a substance like cat litter or coffee grounds. Then throw the medicine away in a sealed container like a sealed bag or a coffee can with a lid. Do not use the medicine after the expiration date. Store at room temperature between 20 and 25 degrees C (68 and 77 degrees F). NOTE: This sheet is a summary. It may not cover all possible information. If you have questions about this medicine, talk to your doctor, pharmacist, or health care provider.    2016, Elsevier/Gold Standard. (2014-04-06 15:34:55)  

## 2015-07-01 NOTE — Progress Notes (Signed)
PATIENT: Frank Moyer DOB: 21-Jun-1981  REASON FOR VISIT: follow up HISTORY FROM: patient  HISTORY OF PRESENT ILLNESS: Frank Moyer is a 34 year old male with a history of severe hypersomnia. He returns today for follow-up. He is currently taking Nuvgil and tolerating it well. He goes to bed around 10-11 PM and arises at 4:30 AM. He feels that the Nuvigil has improved his daytime sleepiness.  He has had a sleep study in the past that showed borderline sleep apnea.  Patient states that he has  had the PSG with MSLT- I will have to look back in centricity for this report.  Patient's girlfriend states that he has headaches off and on but they think its relating to his vision. Patient states that sometimes the light bothers his eyes and can then cause a headache. Epworth score 7 and fatigue severity score 11.   CD visit 12-24-14. Frank Moyer is here for a yearly revisit he receives Nuvigil from our office. He endorses on medication an Epworth sleepiness score of 6 points the fatigue severity score of 17 points. His current sleep habits are Frank follows; he goes to bed between 10 and 11 usually falls asleep promptly he has to rise already at 4 AM for his job. He works from 6 AM to [place] with the city of [place]. He has been able to control his hypersomnia with medication.  He has seen an ' eye doctor' who told him his photosensitivity was not abnormal. He still has headaches, usually in PM, not mornings. He believes this is allergy/sinus related. He noted no nausea.    Frank Moyer is a meanwhile 34 year old Caucasian gentleman who has persistent and severe hypersomnia if not treated. He has been tolerating Nuvigil well but switch to Provigil generic for cost reasons. He states that on modafinil he is doing very well and he is feeling actually a stronger alertness effect from the generic medication. I will refill today modafinil at 200 mg daily with a 90 day supply and 3 refills. The mentioned  photosensitivity in last visit note has not impaired him. Today's Epworth sleepiness score on modafinil was 7 points fatigue severity is 10 points the patient has no new neurologic or physical ailments.   REVIEW OF SYSTEMS: Full 14 system review of systems performed and notable only for:   Eyes: light sensitivity  Neurological: headache Sleep: snoring   ALLERGIES: Allergies  Allergen Reactions  . Adderall [Amphetamine-Dextroamphet Er] Other (See Comments)    MENTAL STATUS CHANGES  . Penicillins Other (See Comments)    UNKNOWN - Frank A CHILD  . Septra [Sulfamethoxazole-Trimethoprim] Other (See Comments)    UNKNOWN     HOME MEDICATIONS: Outpatient Prescriptions Prior to Visit  Medication Sig Dispense Refill  . esomeprazole (NEXIUM) 40 MG capsule Take 40 mg by mouth daily before breakfast.    . glucose blood test strip Use Frank instructed 100 each 12  . Armodafinil (NUVIGIL) 250 MG tablet TAKE 1 TABLET BY MOUTH EVERY DAY (Patient not taking: Reported on 07/01/2015) 90 tablet 3  . Armodafinil (NUVIGIL) 50 MG tablet Take 0.5 tablets (25 mg total) by mouth daily. (Patient not taking: Reported on 07/01/2015) 45 tablet 2  . modafinil (PROVIGIL) 200 MG tablet Take 1 tablet (200 mg total) by mouth daily. 90 tablet 3   No facility-administered medications prior to visit.    PAST MEDICAL HISTORY: Past Medical History  Diagnosis Date  . GERD (gastroesophageal reflux disease)   . Shoulder  joint pain 08-2011  . Hypoglycemia   . Narcolepsy   . PONV (postoperative nausea and vomiting)   . Sinus headache   . Cough 04/17/2013    to start antibiotic 04/17/2013  . Arthritis     right shoulder  . Rotator cuff tear 03/2013    right  . Anxiety     PAST SURGICAL HISTORY: Past Surgical History  Procedure Laterality Date  . Cyst excision  1998    bladder  . Tympanostomy tube placement      Frank a child  . Colonoscopy with propofol  07/02/2012    Procedure: COLONOSCOPY WITH PROPOFOL;  Surgeon:  Arta Silence, MD;  Location: [facility];  Service: Endoscopy;  Laterality: N/A;  . Shoulder arthroscopy with rotator cuff repair Right 04/20/2013    Procedure: SHOULDER ARTHROSCOPY WITH POSTERIOR ROTATOR CUFF REPAIR and debridement;  Surgeon: Nita Sells, MD;  Location: [facility];  Service: Orthopedics;  Laterality: Right;  Right shoulder arthroscopy posterior rotator cuff repair and debridement    FAMILY HISTORY: Family History  Problem Relation Age of Onset  . Heart disease Father     MI    SOCIAL HISTORY: Social History   Social History  . Marital Status: Single    Spouse Name: N/A  . Number of Children: 2  . Years of Education: GED   Occupational History  .     Social History Main Topics  . Smoking status: Current Every Day Smoker -- 1.00 packs/day for 25 years    Types: Cigarettes  . Smokeless tobacco: Never Used  . Alcohol Use: No  . Drug Use: No  . Sexual Activity: Not on file   Other Topics Concern  . Not on file   Social History Narrative   Patient is single and lives with his family.   Patient has two children.   Patient is working full-time on light duty from shoulder surgery.   Patient is right- handed.   Patient has a GED.      Drinks one cup of caffeine on Saturdays.      PHYSICAL EXAM  Filed Vitals:   07/01/15 0805  BP: 120/80  Pulse: 74  Height: 5\' 11"  (1.803 m)  Weight: 227 lb 8 oz (103.193 kg)   Body mass index is 31.74 kg/(m^2).  Generalized: Well developed, in no acute distress   Neurological examination  Mentation: Alert oriented to time, place, history taking. Follows all commands speech and language fluent Cranial nerve Pupils were equal round reactive to light. Extraocular movements were full,  visual field were full on confrontational test. Facial sensation and strength were normal.  Uvula tongue midline.  Head turning and shoulder shrug  were normal and symmetric. Motor: The motor testing reveals  5 out of  5 strength of all 4 extremities, symmetric motor tone and muscle bulk  is noted throughout.  Sensory: Sensory testing is intact to soft touch on all 4 extremities. No evidence of extinction is noted.  Coordination: Cerebellar testing reveals good finger-nose-finger and heel-to-shin bilaterally.  Gait and station: Gait is normal. Tandem gait is normal. Romberg is negative. No drift is seen.  Reflexes: Deep tendon reflexes are symmetric  bilaterally. Babinski downgoing.     DIAGNOSTIC DATA (LABS, IMAGING, TESTING) - I reviewed patient records, labs, notes, testing and imaging myself where available.  Lab Results  Component Value Date   WBC 8.1 01/14/2014   HGB 14.3 01/14/2014   HCT 42.0 01/14/2014   MCV 93.3 01/14/2014  PLT 164 01/14/2014      Component Value Date/Time   NA 141 01/14/2014 2005   K 3.8 01/14/2014 2005   CL 101 01/14/2014 2005   CO2 22 01/14/2014 2005   GLUCOSE 134* 01/14/2014 2005   BUN 11 01/14/2014 2005   CREATININE 1.05 01/14/2014 2005   CALCIUM 9.4 01/14/2014 2005   PROT 6.8 01/14/2014 2005   ALBUMIN 4.2 01/14/2014 2005   AST 23 01/14/2014 2005   ALT 30 01/14/2014 2005   ALKPHOS 65 01/14/2014 2005   BILITOT 0.3 01/14/2014 2005   GFRNONAA >90 01/14/2014 2005   GFRAA >90 01/14/2014 2005       ASSESSMENT AND PLAN 34 y.o. year old male  has a past medical history of GERD (gastroesophageal reflux disease); Shoulder joint pain (08-2011); Hypoglycemia; Narcolepsy; PONV (postoperative nausea and vomiting); Sinus headache; Cough (04/17/2013); Arthritis; Rotator cuff tear (03/2013); and Anxiety. here wit:  1. Hypersomnia  Patient is doing well with the nuvigil. Epworth is 6 and fatigue score is 17.  Daytime sleepiness is controlled with Nuvigil.  Continue Nuvigil, I will refill today for 6 month .    If symptoms worsen or if he develops new symptoms then he should let us know Diona Peregoy , MD     07/01/2015, 8:23 AM [facility] [address], [place],  [address] [phone]

## 2015-07-28 ENCOUNTER — Ambulatory Visit: Payer: 59 | Admitting: Adult Health

## 2015-11-23 ENCOUNTER — Other Ambulatory Visit: Payer: Self-pay

## 2015-11-23 DIAGNOSIS — G4710 Hypersomnia, unspecified: Secondary | ICD-10-CM

## 2015-11-23 DIAGNOSIS — G4720 Circadian rhythm sleep disorder, unspecified type: Secondary | ICD-10-CM

## 2015-11-23 MED ORDER — MODAFINIL 200 MG PO TABS: 200.0000 mg | ORAL_TABLET | Freq: Every day | ORAL | Status: DC

## 2015-11-23 NOTE — Telephone Encounter (Signed)
Faxed RX to [facility]. Received a receipt of confirmation.

## 2016-01-05 ENCOUNTER — Encounter: Payer: Self-pay | Admitting: Neurology

## 2016-01-05 ENCOUNTER — Ambulatory Visit (INDEPENDENT_AMBULATORY_CARE_PROVIDER_SITE_OTHER): Payer: 59 | Admitting: Neurology

## 2016-01-05 VITALS — BP 132/82 | HR 70 | Resp 20 | Ht 70.0 in | Wt 223.0 lb

## 2016-01-05 DIAGNOSIS — G4720 Circadian rhythm sleep disorder, unspecified type: Secondary | ICD-10-CM | POA: Diagnosis not present

## 2016-01-05 DIAGNOSIS — G4710 Hypersomnia, unspecified: Secondary | ICD-10-CM | POA: Diagnosis not present

## 2016-01-05 MED ORDER — MODAFINIL 200 MG PO TABS: 200.0000 mg | ORAL_TABLET | Freq: Every day | ORAL | Status: DC

## 2016-01-05 NOTE — Progress Notes (Signed)
PATIENT: Zyad Glessner DOB: 1980/11/02  REASON FOR VISIT: follow up HISTORY FROM: patient  HISTORY OF PRESENT ILLNESS: Mr. Richburg is a 35 year old male with a history of severe hypersomnia. He returns today for follow-up. He is currently taking Nuvgil and tolerating it well. He goes to bed around 10-11 PM and arises at 4:30 AM. He feels that the Nuvigil has improved his daytime sleepiness.  He has had a sleep study in the past that showed borderline sleep apnea.  Patient states that he has  had the PSG with MSLT- I will have to look back in centricity for this report.  Patient's girlfriend states that he has headaches off and on but they think its relating to his vision. Patient states that sometimes the light bothers his eyes and can then cause a headache. Epworth score 7 and fatigue severity score 11.   CD visit 12-24-14. Mr. Janae Bridgeman is here for a yearly revisit he receives Nuvigil from our office. He endorses on medication an Epworth sleepiness score of 6 points the fatigue severity score of 17 points. His current sleep habits are as follows; he goes to bed between 10 and 11 usually falls asleep promptly he has to rise already at 4 AM for his job. He works from 6 AM to [place] with the city of [place]. He has been able to control his hypersomnia with medication.  He has seen an ' eye doctor' who told him his photosensitivity was not abnormal. He still has headaches, usually in PM, not mornings. He believes this is allergy/sinus related. He noted no nausea.    01-05-2016 Mr.  Poovey is a meanwhile 35 year old Caucasian gentleman who has had persistent and severe hypersomnia if not treated. He has been tolerating Nuvigil well but switched to Provigil  (generic) for cost reasons.  He stated in 06-2015  that on modafinil he is doing very well and he is feeling actually a stronger alertness effect from the generic medication. Today he reports that he has weaned himself successfully off and  does not feel sleepy or when not on Nuvigil or Provigil. However he would like a  supply to be available to him should his symptoms resume. I will refill today modafinil at 200 mg daily with a 90 day supply and 3 refills. The mentioned photosensitivity in last visit note has not impaired him any longer .  Today's Epworth sleepiness score on modafinil was 3 points fatigue severity is 11 points the patient has no new neurologic or physical ailments.   REVIEW OF SYSTEMS: Full 14 system review of systems performed and notable only for:   Eyes: light sensitivity  Neurological: headache Sleep: snoring   ALLERGIES: Allergies  Allergen Reactions  . Adderall [Amphetamine-Dextroamphet Er] Other (See Comments)    MENTAL STATUS CHANGES  . Penicillins Other (See Comments)    UNKNOWN - AS A CHILD  . Septra [Sulfamethoxazole-Trimethoprim] Other (See Comments)    UNKNOWN     HOME MEDICATIONS: Outpatient Prescriptions Prior to Visit  Medication Sig Dispense Refill  . esomeprazole (NEXIUM) 40 MG capsule Take 40 mg by mouth daily before breakfast.    . glucose blood test strip Use as instructed 100 each 12  . modafinil (PROVIGIL) 200 MG tablet Take 1 tablet (200 mg total) by mouth daily. (Patient not taking: Reported on 01/05/2016) 90 tablet 3   No facility-administered medications prior to visit.    PAST MEDICAL HISTORY: Past Medical History  Diagnosis Date  . GERD (  gastroesophageal reflux disease)   . Shoulder joint pain 08-2011  . Hypoglycemia   . Narcolepsy   . PONV (postoperative nausea and vomiting)   . Sinus headache   . Cough 04/17/2013    to start antibiotic 04/17/2013  . Arthritis     right shoulder  . Rotator cuff tear 03/2013    right  . Anxiety     PAST SURGICAL HISTORY: Past Surgical History  Procedure Laterality Date  . Cyst excision  1998    bladder  . Tympanostomy tube placement      as a child  . Colonoscopy with propofol  07/02/2012    Procedure: COLONOSCOPY WITH  PROPOFOL;  Surgeon: Arta Silence, MD;  Location: [facility];  Service: Endoscopy;  Laterality: N/A;  . Shoulder arthroscopy with rotator cuff repair Right 04/20/2013    Procedure: SHOULDER ARTHROSCOPY WITH POSTERIOR ROTATOR CUFF REPAIR and debridement;  Surgeon: Nita Sells, MD;  Location: [facility];  Service: Orthopedics;  Laterality: Right;  Right shoulder arthroscopy posterior rotator cuff repair and debridement    FAMILY HISTORY: Family History  Problem Relation Age of Onset  . Heart disease Father     MI    SOCIAL HISTORY: Social History   Social History  . Marital Status: Single    Spouse Name: N/A  . Number of Children: 2  . Years of Education: GED   Occupational History  .     Social History Main Topics  . Smoking status: Current Every Day Smoker -- 1.00 packs/day for 25 years    Types: Cigarettes  . Smokeless tobacco: Never Used  . Alcohol Use: No  . Drug Use: No  . Sexual Activity: Not on file   Other Topics Concern  . Not on file   Social History Narrative   Patient is single and lives with his family.   Patient has two children.   Patient is working full-time on light duty from shoulder surgery.   Patient is right- handed.   Patient has a GED.      Drinks one cup of caffeine on Saturdays.      PHYSICAL EXAM  Filed Vitals:   01/05/16 1544  BP: 132/82  Pulse: 70  Resp: 20  Height: 5\' 10"  (1.778 m)  Weight: 223 lb (101.152 kg)   Body mass index is 32 kg/(m^2).  Generalized: Well developed, in no acute distress   Neurological examination  Mentation: Alert oriented to time, place, history taking. Follows all commands speech and language fluent, poor dentition.  Cranial nerve Pupils were equal round reactive to light. Extraocular movements were full,  visual field were full on confrontational test.  Facial sensation and strength were normal.  Uvula and tongue midline.  Head turning and shoulder shrug  were normal  and symmetric. Motor:  5 / 5 strength of all 4 extremities, symmetric motor tone and muscle bulk throughout.  Sensory: Sensory testing is intact to soft touch on all 4 extremities. No evidence of extinction is noted.     DIAGNOSTIC DATA (LABS, IMAGING, TESTING) - I reviewed patient records, labs, notes, testing and imaging myself where available.  ASSESSMENT AND PLAN 35 y.o. year old male  has a past medical history of GERD (gastroesophageal reflux disease); Shoulder joint pain (08-2011); Hypoglycemia; Narcolepsy; PONV (postoperative nausea and vomiting); Sinus headache; Cough (04/17/2013); Arthritis; Rotator cuff tear (03/2013); and Anxiety. here seen for a 25 minute RV with hypersomnia, cyclic :  1. Hypersomnia  Patient was doing well  on nuvigil in 2016 . Epworth was 6 and fatigue score  17.  By spring of this year he felt that the Nuvigil did no longer suppress his sleepiness and he weaned himself off. Now without a stimulant medication he endorsed the Epworth Sleepiness Scale at 3 points and the fatigue severity scale at 11 points. He denies having had side effects of headaches or nausea or blood pressure peaks. He was also not anxious on the medication.  Daytime sleepiness is controlled without Nuvigil now .  Mr. Chrisman has just begun drinking a little bit more coffee in the morning he reports and that's keeps him going to the day. His work hours are from 6:15 AM until 4.45 PM, he works 4 days 10 hour shifts. Since there is no refill needed, he may have PRN  a RV with NP.    If symptoms worsen or if he develops new symptoms then he should let us know.     Divon Krabill , MD     01/05/2016, 4:01 PM [facility] [address] [place], [place] [address] [phone]

## 2016-09-27 DIAGNOSIS — J209 Acute bronchitis, unspecified: Secondary | ICD-10-CM | POA: Diagnosis not present

## 2016-09-27 DIAGNOSIS — R6889 Other general symptoms and signs: Secondary | ICD-10-CM | POA: Diagnosis not present

## 2016-09-27 DIAGNOSIS — M545 Low back pain: Secondary | ICD-10-CM | POA: Diagnosis not present

## 2016-10-19 DIAGNOSIS — R7303 Prediabetes: Secondary | ICD-10-CM | POA: Diagnosis not present

## 2016-10-19 DIAGNOSIS — E782 Mixed hyperlipidemia: Secondary | ICD-10-CM | POA: Diagnosis not present

## 2016-11-15 DIAGNOSIS — E119 Type 2 diabetes mellitus without complications: Secondary | ICD-10-CM | POA: Diagnosis not present

## 2016-11-23 DIAGNOSIS — M67911 Unspecified disorder of synovium and tendon, right shoulder: Secondary | ICD-10-CM | POA: Diagnosis not present

## 2016-12-21 ENCOUNTER — Other Ambulatory Visit: Payer: Self-pay | Admitting: Orthopedic Surgery

## 2016-12-21 DIAGNOSIS — M67911 Unspecified disorder of synovium and tendon, right shoulder: Secondary | ICD-10-CM

## 2016-12-21 DIAGNOSIS — M67912 Unspecified disorder of synovium and tendon, left shoulder: Principal | ICD-10-CM

## 2016-12-29 ENCOUNTER — Ambulatory Visit
Admission: RE | Admit: 2016-12-29 | Discharge: 2016-12-29 | Disposition: A | Discharge status: Home / Self Care | Payer: 59 | Source: Ambulatory Visit | Attending: Orthopedic Surgery | Admitting: Orthopedic Surgery

## 2016-12-29 DIAGNOSIS — M67911 Unspecified disorder of synovium and tendon, right shoulder: Secondary | ICD-10-CM

## 2016-12-29 DIAGNOSIS — M67912 Unspecified disorder of synovium and tendon, left shoulder: Principal | ICD-10-CM

## 2018-02-06 ENCOUNTER — Other Ambulatory Visit: Payer: Self-pay | Admitting: Family Medicine

## 2018-02-06 DIAGNOSIS — M5416 Radiculopathy, lumbar region: Secondary | ICD-10-CM

## 2018-02-14 ENCOUNTER — Ambulatory Visit
Admission: RE | Admit: 2018-02-14 | Discharge: 2018-02-14 | Disposition: A | Discharge status: Home / Self Care | Payer: 59 | Source: Ambulatory Visit | Attending: Family Medicine | Admitting: Family Medicine

## 2018-02-14 ENCOUNTER — Inpatient Hospital Stay
Admission: RE | Admit: 2018-02-14 | Discharge: 2018-02-14 | Disposition: A | Discharge status: Home / Self Care | Payer: 59 | Source: Ambulatory Visit | Attending: Family Medicine | Admitting: Family Medicine

## 2018-02-14 DIAGNOSIS — M5416 Radiculopathy, lumbar region: Secondary | ICD-10-CM

## 2018-03-11 DIAGNOSIS — G4733 Obstructive sleep apnea (adult) (pediatric): Secondary | ICD-10-CM | POA: Diagnosis not present

## 2018-03-11 DIAGNOSIS — G47419 Narcolepsy without cataplexy: Secondary | ICD-10-CM | POA: Diagnosis not present

## 2018-03-14 ENCOUNTER — Encounter (INDEPENDENT_AMBULATORY_CARE_PROVIDER_SITE_OTHER): Payer: Self-pay | Admitting: Physical Medicine and Rehabilitation

## 2018-03-14 ENCOUNTER — Ambulatory Visit (INDEPENDENT_AMBULATORY_CARE_PROVIDER_SITE_OTHER): Payer: Self-pay

## 2018-03-14 ENCOUNTER — Ambulatory Visit (INDEPENDENT_AMBULATORY_CARE_PROVIDER_SITE_OTHER): Payer: 59 | Admitting: Physical Medicine and Rehabilitation

## 2018-03-14 VITALS — BP 133/90 | HR 66

## 2018-03-14 DIAGNOSIS — M5416 Radiculopathy, lumbar region: Secondary | ICD-10-CM | POA: Diagnosis not present

## 2018-03-14 DIAGNOSIS — R0602 Shortness of breath: Secondary | ICD-10-CM | POA: Diagnosis not present

## 2018-03-14 MED ORDER — METHYLPREDNISOLONE ACETATE 80 MG/ML IJ SUSP: 80.0000 mg | Freq: Once | INTRAMUSCULAR | Status: DC

## 2018-03-14 NOTE — Progress Notes (Signed)
 .  Numeric Pain Rating Scale and Functional Assessment Average Pain 6   In the last MONTH (on 0-10 scale) has pain interfered with the following?  1. General activity like being  able to carry out your everyday physical activities such as walking, climbing stairs, carrying groceries, or moving a chair?  Rating(6)   +Driver, -BT, -Dye Allergies.  

## 2018-03-19 ENCOUNTER — Telehealth (INDEPENDENT_AMBULATORY_CARE_PROVIDER_SITE_OTHER): Payer: Self-pay | Admitting: Physical Medicine and Rehabilitation

## 2018-03-19 ENCOUNTER — Encounter (INDEPENDENT_AMBULATORY_CARE_PROVIDER_SITE_OTHER): Payer: Self-pay | Admitting: Physical Medicine and Rehabilitation

## 2018-03-19 NOTE — Patient Instructions (Signed)

## 2018-03-19 NOTE — Procedures (Signed)
Lumbar Epidural Steroid Injection - Interlaminar Approach with Fluoroscopic Guidance  Patient: Frank Moyer      Date of Birth: 1980-10-09 MRN: [id number] PCP: Shirline Frees, MD      Visit Date: 03/14/2018   Universal Protocol:     Consent Given By: the patient  Position: PRONE  Additional Comments: Vital signs were monitored before and after the procedure. Patient was prepped and draped in the usual sterile fashion. The correct patient, procedure, and site was verified.   Injection Procedure Details:  Procedure Site One Meds Administered:  Meds ordered this encounter  Medications  . methylPREDNISolone acetate (DEPO-MEDROL) injection 80 mg     Laterality: Right  Location/Site:  L5-S1  Needle size: 20 G  Needle type: Tuohy  Needle Placement: Paramedian epidural  Findings:   -Comments: Excellent flow of contrast into the epidural space.  Procedure Details: Using a paramedian approach from the side mentioned above, the region overlying the inferior lamina was localized under fluoroscopic visualization and the soft tissues overlying this structure were infiltrated with 4 ml. of 1% Lidocaine without Epinephrine. The Tuohy needle was inserted into the epidural space using a paramedian approach.   The epidural space was localized using loss of resistance along with lateral and bi-planar fluoroscopic views.  After negative aspirate for air, blood, and CSF, a 2 ml. volume of Isovue-250 was injected into the epidural space and the flow of contrast was observed. Radiographs were obtained for documentation purposes.    The injectate was administered into the level noted above.   Additional Comments:  The patient tolerated the procedure well Dressing: Band-Aid    Post-procedure details: Patient was observed during the procedure. Post-procedure instructions were reviewed.  Patient left the clinic in stable condition.

## 2018-03-19 NOTE — Progress Notes (Signed)
Frank Moyer - 37 y.o. male MRN [id number]  Date of birth: [birth year]  Office Visit Note: Visit Date: 03/14/2018 PCP: Shirline Frees, MD Referred by: Shirline Frees, MD  Subjective: Chief Complaint  Patient presents with  . Lower Back - Pain  . Right Leg - Numbness, Pain  . Left Leg - Numbness, Pain   HPI: Frank Moyer is a 37 year old gentleman that comes in today at the request of his primary care physician Dr. Shirline Frees in [facility] for evaluation management and possible injection for the patient's chronic worsening low back pain and bilateral leg pain and numbness.  His history is that he has been having 2 years of chronic back pain that he says refers into both legs with some paresthesia type symptoms in the anterior lateral thighs.  This is gotten worse over the last 6 months.  He endorses worsening pain if he stands for too long.  He states that medication tends to help somewhat.  He rates his average pain as a 6 out of 10.  It does limit his activities daily including being able to do climbing stairs and other typical activities.  He does not endorse specific injury or focal weakness such as foot drop but does feel like his legs are weak at times and he feels like they want to give out.  He feels like he has less endurance.  He does get symptoms down into the calves are more of an L5 distribution bilaterally.  He does not necessarily feel that one side is worse than the other although maybe the right more than left.  He has tried medications including anti-inflammatories and other medications without relief.  He has not had any prior back surgery or spinal injections.  He has had no electrodiagnostic studies.  He has no fevers chills or night sweats.  He has no unexplained weight loss.  He has no bowel or bladder changes.  History somewhat complicated medically with hypersomnolence as well as anxiety state.  Dr. Kenton Kingfisher did obtain an MRI of the lumbar spine and this is  reviewed with the patient today using spine model and the images themselves.  At least to my as it shows central disc protrusion at L5-S1 with small annular tear no focal compression but patient seems to have some narrowing of the spine congenitally.  There is small facet joint cyst but posterior at L3-4 on the right but does not impact the canal.  There is no high-grade stenosis or nerve compression.   Review of Systems  Constitutional: Negative for chills, fever, malaise/fatigue and weight loss.  HENT: Negative for hearing loss and sinus pain.   Eyes: Negative for blurred vision, double vision and photophobia.  Respiratory: Negative for cough and shortness of breath.   Cardiovascular: Negative for chest pain, palpitations and leg swelling.  Gastrointestinal: Negative for abdominal pain, nausea and vomiting.  Genitourinary: Negative for flank pain.  Musculoskeletal: Positive for back pain. Negative for myalgias.       Bilateral leg pain and paresthesia  Skin: Negative for itching and rash.  Neurological: Positive for tingling and weakness. Negative for tremors and focal weakness.  Endo/Heme/Allergies: Negative.   Psychiatric/Behavioral: Negative for depression. The patient is nervous/anxious.   All other systems reviewed and are negative.  Otherwise per HPI.  Assessment & Plan: Visit Diagnoses:  1. Lumbar radiculopathy     Plan: Findings:  Chronic worsening low back pain and bilateral leg pain more of an L5 distribution that seems to  be consistent with small disc herniation protrusion at L5-S1 which is central and more to the right but essentially central.  There is small annular tear.  He could be getting a radiculitis in the both legs which could feel like weakness or feel like there is giveaway weakness but there should be no nerve compression or focal weakness from this type of lesion in the spine.  No other significant findings in the lumbar spine on imaging.  Patient is failed  conservative care otherwise and we did talk about doing an interlaminar epidural steroid injection today at L5-S1.  If he gets some relief but it is not long-lasting to look at a transforaminal approach versus repeating x1.  If he gets a lot of relief that I think the next step is to see how long that would last and see if it will keep it at [place].  We talked about the natural history of the small disc herniations and what I called tweaking the spine.  Patients will get severe pain at times even with small movements and it can be chronic and flaring up from time to time.  We talked about core strengthening and exercises at length with him.  These exercises do not need to be started until the pain is subsided but I do think over time given his age of 37 developing good core strength and lifting habits is going to be paramount for his future.  Overall he actually has a very good appearing spine at this point a small disc herniation at L5-S1.  Lumbar epidural injection was performed today and is dictated separately.  We will see him back as needed and I told him to give this a couple of weeks total and will see worries that at that point.  Exam was really benign and nonfocal.  There is no red flag issues to even warrant consideration of surgery.    Meds & Orders:  Meds ordered this encounter  Medications  . methylPREDNISolone acetate (DEPO-MEDROL) injection 80 mg    Orders Placed This Encounter  Procedures  . XR C-ARM NO REPORT  . Epidural Steroid injection    Follow-up: Return if symptoms worsen or fail to improve.   Procedures: No procedures performed  Lumbar Epidural Steroid Injection - Interlaminar Approach with Fluoroscopic Guidance  Patient: Frank Moyer      Date of Birth: [birth year] MRN: [id number] PCP: Shirline Frees, MD      Visit Date: 03/14/2018   Universal Protocol:     Consent Given By: the patient  Position: PRONE  Additional Comments: Vital signs were monitored before and  after the procedure. Patient was prepped and draped in the usual sterile fashion. The correct patient, procedure, and site was verified.   Injection Procedure Details:  Procedure Site One Meds Administered:  Meds ordered this encounter  Medications  . methylPREDNISolone acetate (DEPO-MEDROL) injection 80 mg     Laterality: Right  Location/Site:  L5-S1  Needle size: 20 G  Needle type: Tuohy  Needle Placement: Paramedian epidural  Findings:   -Comments: Excellent flow of contrast into the epidural space.  Procedure Details: Using a paramedian approach from the side mentioned above, the region overlying the inferior lamina was localized under fluoroscopic visualization and the soft tissues overlying this structure were infiltrated with 4 ml. of 1% Lidocaine without Epinephrine. The Tuohy needle was inserted into the epidural space using a paramedian approach.   The epidural space was localized using loss of resistance along with lateral and bi-planar  fluoroscopic views.  After negative aspirate for air, blood, and CSF, a 2 ml. volume of Isovue-250 was injected into the epidural space and the flow of contrast was observed. Radiographs were obtained for documentation purposes.    The injectate was administered into the level noted above.   Additional Comments:  The patient tolerated the procedure well Dressing: Band-Aid    Post-procedure details: Patient was observed during the procedure. Post-procedure instructions were reviewed.  Patient left the clinic in stable condition.   Clinical History: MRI LUMBAR SPINE WITHOUT CONTRAST  TECHNIQUE: Multiplanar, multisequence MR imaging of the lumbar spine was performed. No intravenous contrast was administered.  COMPARISON:  CT abdomen and pelvis 06/06/2012  FINDINGS: Segmentation:  Standard.  Alignment:  Normal.  Vertebrae: No fracture, suspicious osseous lesion, or significant marrow edema.  Conus medullaris  and cauda equina: Conus extends to the L1 level. Conus and cauda equina appear normal.  Paraspinal and other soft tissues: Unremarkable.  Disc levels:  T12-L1 through L1-2: Negative.  L2-3: Normal disc. Subcentimeter cyst post row inferior to the right facet joint, not in a position to cause neural impingement. No stenosis.  L3-4: Negative.  L4-5: Shallow left foraminal disc protrusion results in mild left neural foraminal stenosis without evidence of L4 nerve root compression or spinal stenosis.  L5-S1: Disc desiccation. Small central disc protrusion with punctate annular fissure. No stenosis or evidence of S1 nerve root impingement.  IMPRESSION: 1. Small central disc protrusion at L5-S1 without stenosis. 2. Shallow left foraminal disc protrusion at L4-5 without compressive stenosis.   Electronically Signed   By: Logan Bores M.D.   On: 02/14/2018 16:31   He reports that he has been smoking cigarettes. He has a 25.00 pack-year smoking history. He has never used smokeless tobacco. No results for input(s): HGBA1C, LABURIC in the last 8760 hours.  Objective:  VS:  HT:    WT:   BMI:     BP:133/90  HR:66bpm  TEMP: ( )  RESP:  Physical Exam  Constitutional: He is oriented to person, place, and time. He appears well-developed and well-nourished. No distress.  HENT:  Head: Normocephalic and atraumatic.  Nose: Nose normal.  Mouth/Throat: Oropharynx is clear and moist.  Eyes: Pupils are equal, round, and reactive to light. Conjunctivae are normal.  Neck: Normal range of motion. Neck supple. No tracheal deviation present.  Cardiovascular: Regular rhythm and intact distal pulses.  Pulmonary/Chest: Effort normal and breath sounds normal.  Abdominal: Soft. He exhibits no distension. There is no rebound and no guarding.  Musculoskeletal: He exhibits no deformity.  Patient ambulates without aid.  He has good distal strength with dorsiflexion plantarflexion and EHL  bilaterally.  He has no clonus bilaterally.  He has an equivocal slump test on the right less so on the left.  No pain over the greater trochanters and no pain with hip rotation.  He does have some pain with forward flexion of the lumbar spine more than extension.  No focal trigger points noted.  Neurological: He is alert and oriented to person, place, and time. He exhibits normal muscle tone. Coordination normal.  Skin: Skin is warm. No rash noted.  Psychiatric: He has a normal mood and affect. His behavior is normal.  Nursing note and vitals reviewed.   Ortho Exam Imaging: No results found.  Past Medical/Family/Surgical/Social History: Medications & Allergies reviewed per EMR, new medications updated. Patient Active Problem List   Diagnosis Date Noted  . Hypersomnia, persistent 09/04/2013  . 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 Hyperglycemia 12/06/2012  . GERD (gastroesophageal reflux disease) 12/06/2012  . Anxiety state, unspecified 12/06/2012  . Narcolepsy 12/06/2012  . Headache(784.0) 12/05/2012   Past Medical History:  Diagnosis Date  . Anxiety   . Arthritis    right shoulder  . Cough 04/17/2013   to start antibiotic 04/17/2013  . GERD (gastroesophageal reflux disease)   . Hypoglycemia   . Narcolepsy   . PONV (postoperative nausea and vomiting)   . Rotator cuff tear 03/2013   right  . Shoulder joint pain 08-2011  . Sinus headache    Family History  Problem Relation Age of Onset  . Heart disease Father        MI   Past Surgical History:  Procedure Laterality Date  . COLONOSCOPY WITH PROPOFOL  07/02/2012   Procedure: COLONOSCOPY WITH PROPOFOL;  Surgeon: Arta Silence, MD;  Location: [facility];  Service: Endoscopy;  Laterality: N/A;  . CYST EXCISION  1998   bladder  . SHOULDER ARTHROSCOPY WITH ROTATOR CUFF REPAIR Right 04/20/2013   Procedure: SHOULDER ARTHROSCOPY WITH POSTERIOR ROTATOR CUFF REPAIR and debridement;  Surgeon:  Nita Sells, MD;  Location: [facility];  Service: Orthopedics;  Laterality: Right;  Right shoulder arthroscopy posterior rotator cuff repair and debridement  . TYMPANOSTOMY TUBE PLACEMENT     as a child   Social History   Occupational History    Employer: [employer]  Tobacco Use  . Smoking status: Current Every Day Smoker    Packs/day: 1.00    Years: 25.00    Pack years: 25.00    Types: Cigarettes  . Smokeless tobacco: Never Used  Substance and Sexual Activity  . Alcohol use: No  . Drug use: No  . Sexual activity: Not on file

## 2018-03-19 NOTE — Telephone Encounter (Signed)
Should be in there and automaticall faxed but please make sure

## 2018-08-22 DIAGNOSIS — G43809 Other migraine, not intractable, without status migrainosus: Secondary | ICD-10-CM | POA: Diagnosis not present

## 2018-08-22 DIAGNOSIS — E119 Type 2 diabetes mellitus without complications: Secondary | ICD-10-CM | POA: Diagnosis not present

## 2018-08-22 DIAGNOSIS — J4520 Mild intermittent asthma, uncomplicated: Secondary | ICD-10-CM | POA: Diagnosis not present

## 2020-10-11 ENCOUNTER — Ambulatory Visit
Admission: RE | Admit: 2020-10-11 | Discharge: 2020-10-11 | Disposition: A | Discharge status: Home / Self Care | Payer: Self-pay | Source: Ambulatory Visit | Attending: Nurse Practitioner | Admitting: Nurse Practitioner

## 2020-10-11 ENCOUNTER — Other Ambulatory Visit: Payer: Self-pay

## 2020-10-11 ENCOUNTER — Other Ambulatory Visit: Payer: Self-pay | Admitting: Nurse Practitioner

## 2020-10-11 DIAGNOSIS — T1490XA Injury, unspecified, initial encounter: Secondary | ICD-10-CM

## 2021-07-06 IMAGING — CR DG HAND COMPLETE 3+V*R*
3 series · 3 of 3 positions shown · non-contrast
Comparison: None.

CLINICAL DATA: Pain at the base of thumb

EXAM:
RIGHT HAND - COMPLETE 3+ VIEW

[x hand pa right]
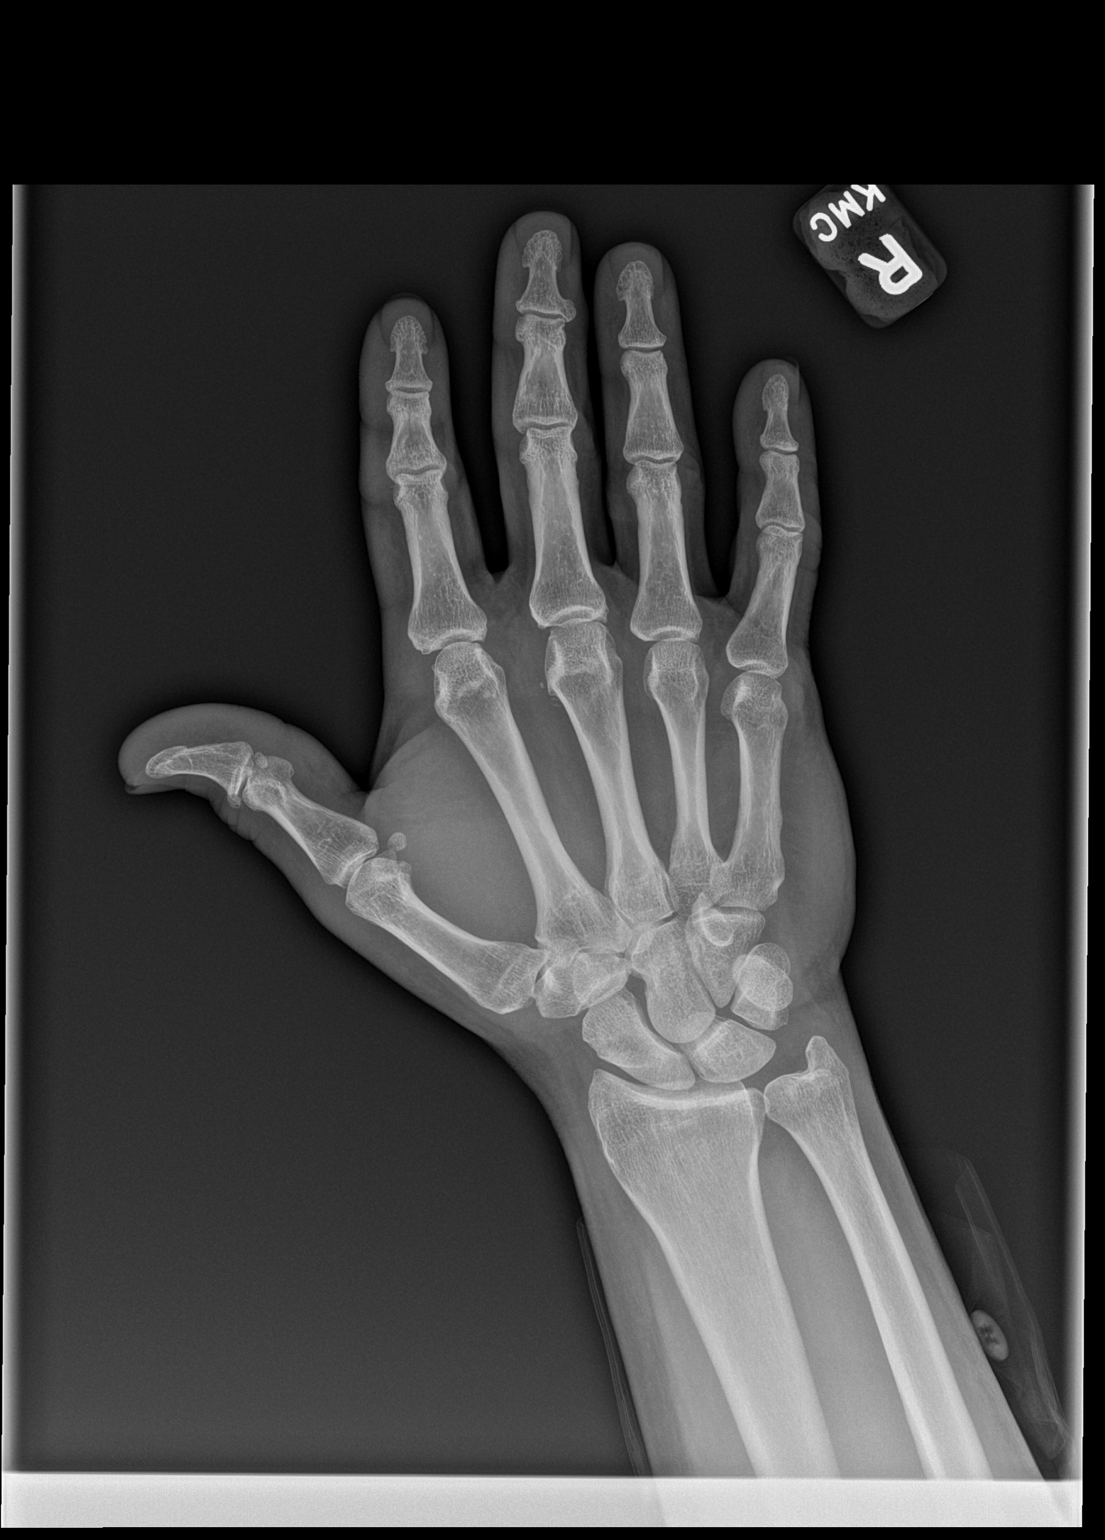

[x hand obl right]
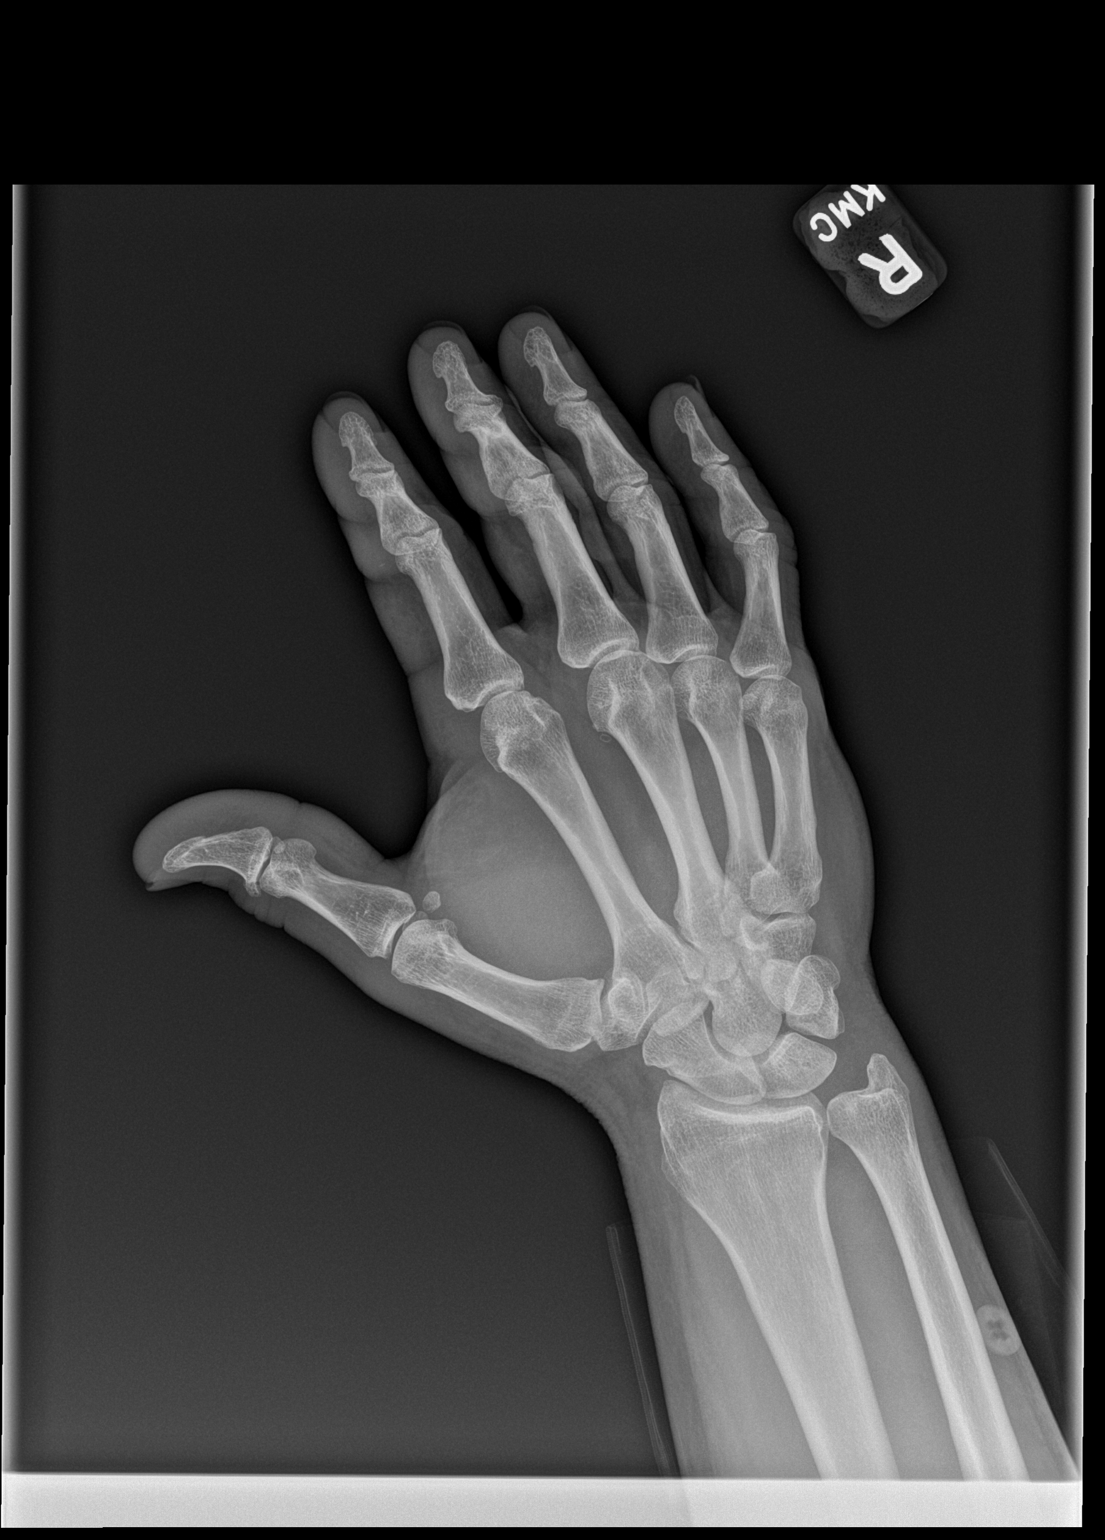

[x hand lat right]
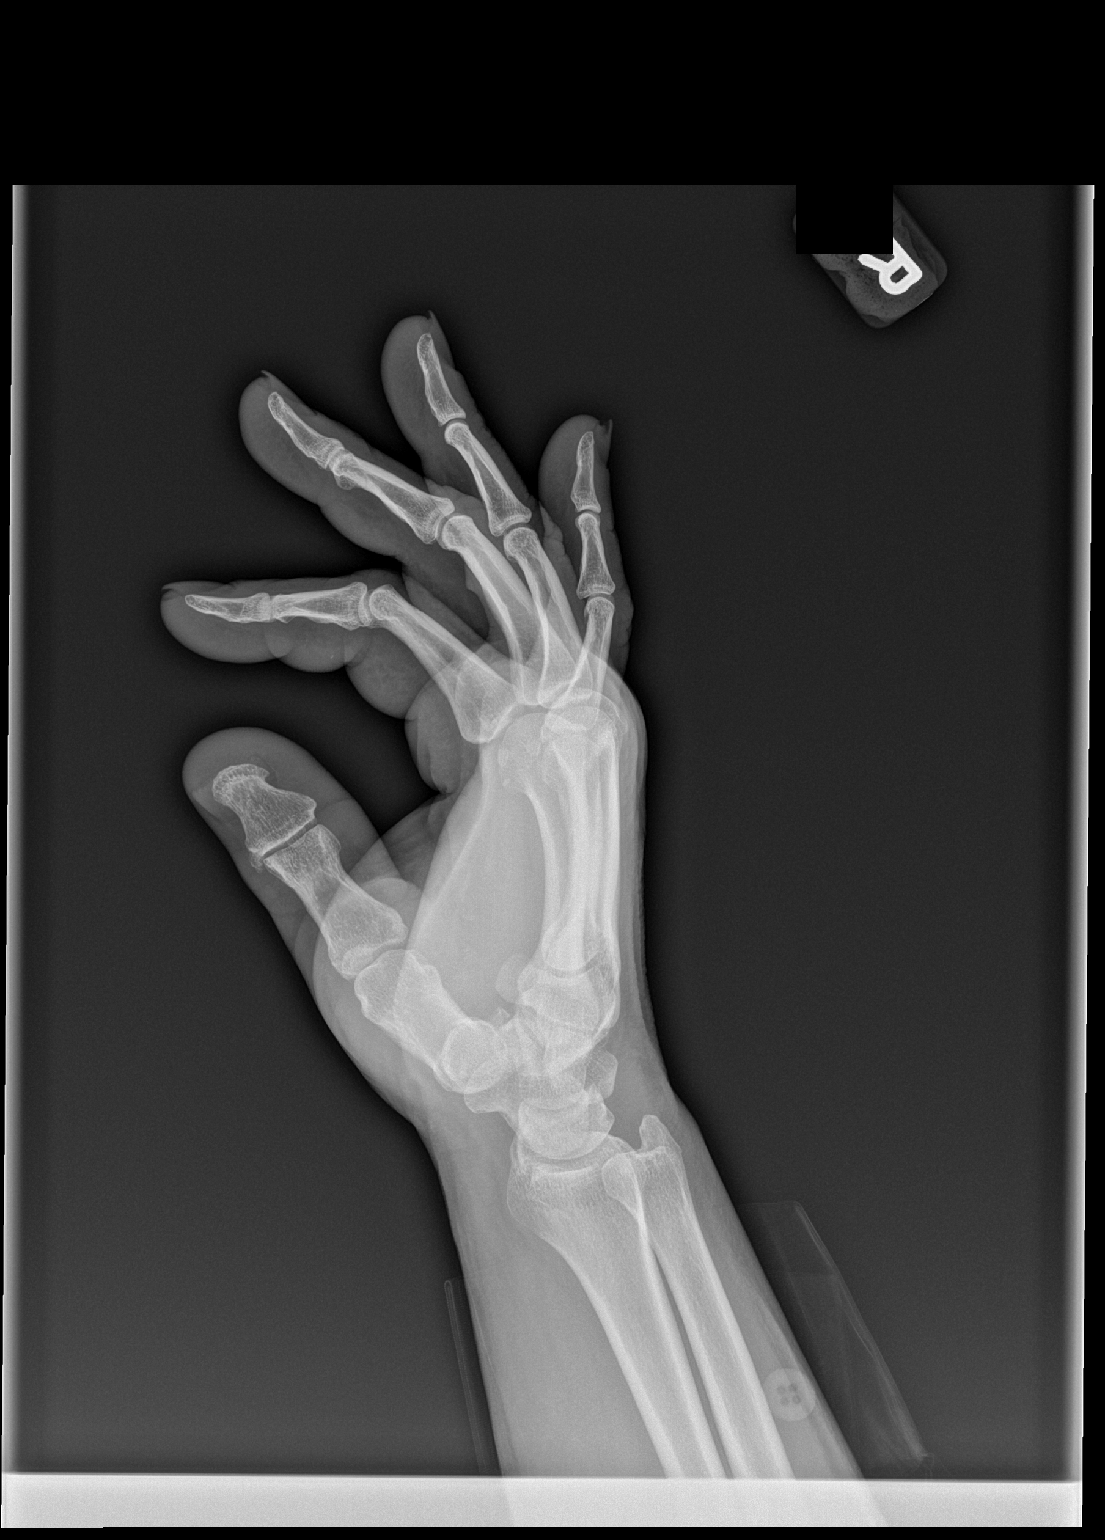

[3 of 3 positions shown; findings below may reference images not displayed]

FINDINGS: No fracture or malalignment. Minimal degenerative change at the
first CMC joint. Soft tissues are unremarkable
IMPRESSION: No acute osseous abnormality.

## 2022-03-13 HISTORY — PX: ROBOTIC ASSISTED LAPAROSCOPIC CHOLECYSTECTOMY: SHX6521

## 2022-04-19 ENCOUNTER — Emergency Department (HOSPITAL_COMMUNITY): Payer: 59

## 2022-04-19 ENCOUNTER — Encounter (HOSPITAL_COMMUNITY): Payer: Self-pay

## 2022-04-19 ENCOUNTER — Other Ambulatory Visit: Payer: Self-pay

## 2022-04-19 ENCOUNTER — Emergency Department (HOSPITAL_COMMUNITY)
Admission: EM | Admit: 2022-04-19 | Discharge: 2022-04-19 | Disposition: A | Discharge status: Home / Self Care | Payer: 59 | Attending: Emergency Medicine | Admitting: Emergency Medicine

## 2022-04-19 DIAGNOSIS — N201 Calculus of ureter: Secondary | ICD-10-CM | POA: Diagnosis not present

## 2022-04-19 DIAGNOSIS — N3001 Acute cystitis with hematuria: Secondary | ICD-10-CM | POA: Insufficient documentation

## 2022-04-19 DIAGNOSIS — R109 Unspecified abdominal pain: Secondary | ICD-10-CM | POA: Diagnosis present

## 2022-04-19 LAB — COMPREHENSIVE METABOLIC PANEL
ALT: 24 U/L (ref 0–44)
AST: 23 U/L (ref 15–41)
Albumin: 4.9 g/dL (ref 3.5–5.0)
Alkaline Phosphatase: 70 U/L (ref 38–126)
Anion gap: 8 (ref 5–15)
BUN: 9 mg/dL (ref 6–20)
CO2: 26 mmol/L (ref 22–32)
Calcium: 9.8 mg/dL (ref 8.9–10.3)
Chloride: 108 mmol/L (ref 98–111)
Creatinine, Ser: 1.05 mg/dL (ref 0.61–1.24)
GFR, Estimated: >60 mL/min (ref >60)
Glucose, Bld: 146 mg/dL — ABNORMAL HIGH (ref 70–99)
Potassium: 3.9 mmol/L (ref 3.5–5.1)
Sodium: 142 mmol/L (ref 135–145)
Total Bilirubin: 1 mg/dL (ref 0.3–1.2)
Total Protein: 7.7 g/dL (ref 6.5–8.1)

## 2022-04-19 LAB — URINALYSIS, ROUTINE W REFLEX MICROSCOPIC
Bacteria, UA: NONE SEEN
Bilirubin Urine: NEGATIVE
Glucose, UA: 50 mg/dL — AB
Ketones, ur: 80 mg/dL — AB
Nitrite: NEGATIVE
Protein, ur: 100 mg/dL — AB
RBC / HPF: >50 RBC/hpf — ABNORMAL HIGH (ref 0–5)
Specific Gravity, Urine: 1.025 (ref 1.005–1.030)
WBC, UA: >50 WBC/hpf — ABNORMAL HIGH (ref 0–5)
pH: 6 (ref 5.0–8.0)

## 2022-04-19 LAB — CBC WITH DIFFERENTIAL/PLATELET
Abs Immature Granulocytes: 0.06 10*3/uL (ref 0.00–0.07)
Basophils Absolute: 0 10*3/uL (ref 0.0–0.1)
Basophils Relative: 0 %
Eosinophils Absolute: 0 10*3/uL (ref 0.0–0.5)
Eosinophils Relative: 0 %
HCT: 45 % (ref 39.0–52.0)
Hemoglobin: 15.1 g/dL (ref 13.0–17.0)
Immature Granulocytes: 0 %
Lymphocytes Relative: 8 %
Lymphs Abs: 1 10*3/uL (ref 0.7–4.0)
MCH: 30.9 pg (ref 26.0–34.0)
MCHC: 33.6 g/dL (ref 30.0–36.0)
MCV: 92 fL (ref 80.0–100.0)
Monocytes Absolute: 0.6 10*3/uL (ref 0.1–1.0)
Monocytes Relative: 4 %
Neutro Abs: 11.8 10*3/uL — ABNORMAL HIGH (ref 1.7–7.7)
Neutrophils Relative %: 88 %
Platelets: 170 10*3/uL (ref 150–400)
RBC: 4.89 MIL/uL (ref 4.22–5.81)
RDW: 13 % (ref 11.5–15.5)
WBC: 13.5 10*3/uL — ABNORMAL HIGH (ref 4.0–10.5)
nRBC: 0 % (ref 0.0–0.2)

## 2022-04-19 LAB — LIPASE, BLOOD: Lipase: 29 U/L (ref 11–51)

## 2022-04-19 MED ORDER — ONDANSETRON 4 MG PO TBDP: 4.0000 mg | ORAL_TABLET | Freq: Three times a day (TID) | ORAL | 0 refills | Status: AC | PRN

## 2022-04-19 MED ORDER — SODIUM CHLORIDE 0.9 % IV SOLN
2.0000 g | Freq: Once | INTRAVENOUS | Status: AC
  Administered 2022-04-19: 2 g via INTRAVENOUS
  Filled 2022-04-19: qty 20

## 2022-04-19 MED ORDER — MORPHINE SULFATE (PF) 4 MG/ML IV SOLN: 4.0000 mg | INTRAVENOUS | Status: DC | PRN

## 2022-04-19 MED ORDER — IOHEXOL 300 MG/ML  SOLN
100.0000 mL | Freq: Once | INTRAMUSCULAR | Status: AC | PRN
  Administered 2022-04-19: 100 mL via INTRAVENOUS

## 2022-04-19 MED ORDER — OXYCODONE-ACETAMINOPHEN 5-325 MG PO TABS: 1.0000 | ORAL_TABLET | Freq: Four times a day (QID) | ORAL | 0 refills | Status: DC | PRN

## 2022-04-19 MED ORDER — CEPHALEXIN 500 MG PO CAPS: 500.0000 mg | ORAL_CAPSULE | Freq: Three times a day (TID) | ORAL | 0 refills | Status: AC

## 2022-04-19 MED ORDER — ONDANSETRON HCL 4 MG/2ML IJ SOLN
4.0000 mg | Freq: Once | INTRAMUSCULAR | Status: AC
  Administered 2022-04-19: 4 mg via INTRAVENOUS
  Filled 2022-04-19: qty 2

## 2022-04-19 MED ORDER — OXYCODONE-ACETAMINOPHEN 5-325 MG PO TABS
1.0000 | ORAL_TABLET | Freq: Once | ORAL | Status: AC
  Administered 2022-04-19: 1 via ORAL
  Filled 2022-04-19: qty 1

## 2022-04-19 MED ORDER — LACTATED RINGERS IV BOLUS
2000.0000 mL | Freq: Once | INTRAVENOUS | Status: AC
  Administered 2022-04-19: 2000 mL via INTRAVENOUS

## 2022-04-19 MED ORDER — KETOROLAC TROMETHAMINE 15 MG/ML IJ SOLN
15.0000 mg | Freq: Once | INTRAMUSCULAR | Status: AC
  Administered 2022-04-19: 15 mg via INTRAVENOUS
  Filled 2022-04-19: qty 1

## 2022-04-19 MED ORDER — TAMSULOSIN HCL 0.4 MG PO CAPS: 0.4000 mg | ORAL_CAPSULE | Freq: Every day | ORAL | 0 refills | Status: AC

## 2022-04-19 NOTE — ED Triage Notes (Signed)
Per EMS- Patient c/o RLQ abdominal pain and vomiting x 24 hours. Patient had his gallbladder removed 1 month ago.  Patient took his own Zofran approx 1 1/2 hours ago.

## 2022-04-19 NOTE — ED Notes (Signed)
Pt needs IV for CT  

## 2022-04-19 NOTE — Discharge Instructions (Addendum)
Your work-up today showed that you have a 10 mm stone. You likely will not pass this on your own.  You will need a follow-up with urology.  I have attached information for you above.  Your symptoms were relatively well controlled in the emergency room.  You received a dose of antibiotic in the emergency room for a UTI.  I have sent additional antibiotic into the pharmacy for you.  For any concerning symptoms such as fever, uncontrolled pain, uncontrolled nausea and vomiting please return to the emergency room for evaluation.  Otherwise please call and follow-up with the urologist.   Patient reserve the pain medicine for severe breakthrough pain.  I recommend you take Tylenol, Motrin primarily.  You can take 1000 mg of Tylenol every 8 hours.  You can take 600 mg of ibuprofen every 6-8 hours.  I would recommend for the next 2 days at least to take Zofran every 8 hours scheduled and then after that you can take it as needed.

## 2022-04-19 NOTE — ED Provider Notes (Signed)
[facility] DEPT Provider Note   CSN: [id number] Arrival date & time: 04/19/22  1509     History  Chief Complaint  Patient presents with   Abdominal Pain   Emesis    Frank Moyer is a 41 y.o. male.  41 year old male presents today for evaluation of right-sided flank pain, abdominal pain, nausea, vomiting, hematuria since yesterday that worsened today.  Hematuria started while patient was in the emergency department.  Has recent history of cholecystectomy sometime this summer.  Last p.o. intake he was able to tolerate was yesterday around lunchtime.  Denies history of kidney stones in the past.  Denies fever.  Denies dysuria.  The history is provided by the patient. No language interpreter was used.       Home Medications Prior to Admission medications   Medication Sig Start Date End Date Taking? Authorizing Provider  esomeprazole (NEXIUM) 40 MG capsule Take 40 mg by mouth daily before breakfast.    [provider]  glucose blood test strip Use as instructed 12/08/12   Renato Shin, MD  modafinil (PROVIGIL) 200 MG tablet Take 1 tablet (200 mg total) by mouth daily. 01/05/16   Dohmeier, Asencion Partridge, MD      Allergies    Adderall [amphetamine-dextroamphet er], Penicillins, and Septra [sulfamethoxazole-trimethoprim]    Review of Systems   Review of Systems  Constitutional:  Negative for chills and fever.  Respiratory:  Negative for shortness of breath.   Gastrointestinal:  Positive for abdominal pain, nausea and vomiting.  Genitourinary:  Positive for flank pain and hematuria. Negative for dysuria.  All other systems reviewed and are negative.   Physical Exam Updated Vital Signs BP (!) 159/99   Pulse 61   Temp 98.8 F (37.1 C) (Oral)   Resp 16   SpO2 96%  Physical Exam Vitals and nursing note reviewed.  Constitutional:      General: He is not in acute distress.    Appearance: Normal appearance. He is not ill-appearing.  HENT:      Head: Normocephalic and atraumatic.     Nose: Nose normal.  Eyes:     General: No scleral icterus.    Extraocular Movements: Extraocular movements intact.     Conjunctiva/sclera: Conjunctivae normal.  Cardiovascular:     Rate and Rhythm: Normal rate and regular rhythm.     Pulses: Normal pulses.     Heart sounds: Normal heart sounds.  Pulmonary:     Effort: Pulmonary effort is normal. No respiratory distress.     Breath sounds: Normal breath sounds. No wheezing or rales.  Abdominal:     General: There is no distension.     Palpations: Abdomen is soft.     Tenderness: There is no abdominal tenderness. There is no right CVA tenderness, left CVA tenderness or guarding.  Musculoskeletal:        General: No deformity. Normal range of motion.     Cervical back: Normal range of motion.  Skin:    General: Skin is warm and dry.     Findings: No rash.  Neurological:     General: No focal deficit present.     Mental Status: He is alert. Mental status is at baseline.     ED Results / Procedures / Treatments   Labs (all labs ordered are listed, but only abnormal results are displayed) Labs Reviewed  CBC WITH DIFFERENTIAL/PLATELET - Abnormal; Notable for the following components:      Result Value   WBC 13.5 (*)  Neutro Abs 11.8 (*)    All other components within normal limits  COMPREHENSIVE METABOLIC PANEL - Abnormal; Notable for the following components:   Glucose, Bld 146 (*)    All other components within normal limits  LIPASE, BLOOD  URINALYSIS, ROUTINE W REFLEX MICROSCOPIC    EKG None  Radiology No results found.  Procedures Procedures    Medications Ordered in ED Medications  iohexol (OMNIPAQUE) 300 MG/ML solution 100 mL (has no administration in time range)  oxyCODONE-acetaminophen (PERCOCET/ROXICET) 5-325 MG per tablet 1 tablet (1 tablet Oral Given 04/19/22 1808)    ED Course/ Medical Decision Making/ A&P                           Medical Decision  Making Risk Prescription drug management.   Medical Decision Making / ED Course   This patient presents to the ED for concern of flank pain, this involves an extensive number of treatment options, and is a complaint that carries with it a high risk of complications and morbidity.  The differential diagnosis includes UTI, pyelonephritis, ureterolithiasis  MDM: 41 year old male presents with flank pain.  Work-up reveals mild leukocytosis likely reactive given emesis.  Renal function preserved.  Vital signs overall stable.  No signs of SIRS criteria.  10 mm stone in the right ureteropelvic junction.  Moderate right hydronephrosis.  10 mm left renal calculus.  Potential associated UTI.  We will lower urine culture.  We will start patient on Keflex.  Dose of Rocephin given in the emergency department.  Case discussed with Dr. Junious Silk of urology.  Given stable vital signs, only mild leukocytosis and afebrile he has low suspicion for infected stone.  He states patient is stable for discharge home Keflex and follow-up outpatient.  Pain medication, Zofran, Flomax, Keflex prescribed.  Patient is appropriate for discharge.  Discharged in stable condition.  Return precautions discussed.   Lab Tests: -I ordered, reviewed, and interpreted labs.   The pertinent results include:   Labs Reviewed  CBC WITH DIFFERENTIAL/PLATELET - Abnormal; Notable for the following components:      Result Value   WBC 13.5 (*)    Neutro Abs 11.8 (*)    All other components within normal limits  COMPREHENSIVE METABOLIC PANEL - Abnormal; Notable for the following components:   Glucose, Bld 146 (*)    All other components within normal limits  URINALYSIS, ROUTINE W REFLEX MICROSCOPIC - Abnormal; Notable for the following components:   Color, Urine RED (*)    APPearance CLOUDY (*)    Glucose, UA 50 (*)    Hgb urine dipstick LARGE (*)    Ketones, ur 80 (*)    Protein, ur 100 (*)    Leukocytes,Ua TRACE (*)    RBC / HPF >50  (*)    WBC, UA >50 (*)    All other components within normal limits  LIPASE, BLOOD      EKG  EKG Interpretation  Date/Time:    Ventricular Rate:    PR Interval:    QRS Duration:   QT Interval:    QTC Calculation:   R Axis:     Text Interpretation:           Imaging Studies ordered: I ordered imaging studies including CT abdomen pelvis with contrast I independently visualized and interpreted imaging. I agree with the radiologist interpretation   Medicines ordered and prescription drug management: Meds ordered this encounter  Medications   oxyCODONE-acetaminophen (  PERCOCET/ROXICET) 5-325 MG per tablet 1 tablet   iohexol (OMNIPAQUE) 300 MG/ML solution 100 mL   lactated ringers bolus 2,000 mL   ondansetron (ZOFRAN) injection 4 mg   morphine (PF) 4 MG/ML injection 4 mg   cefTRIAXone (ROCEPHIN) 2 g in sodium chloride 0.9 % 100 mL IVPB    Order Specific Question:   Antibiotic Indication:    Answer:   UTI   cephALEXin (KEFLEX) 500 MG capsule    Sig: Take 1 capsule (500 mg total) by mouth 3 (three) times daily for 7 days.    Dispense:  21 capsule    Refill:  0    Discussion had with patient.  Patient would like to continue with Keflex.  His reaction to penicillin was when he was a kid.    Order Specific Question:   Supervising Provider    Answer:   Noemi Chapel [[id number]]   ondansetron (ZOFRAN-ODT) 4 MG disintegrating tablet    Sig: Take 1 tablet (4 mg total) by mouth every 8 (eight) hours as needed.    Dispense:  20 tablet    Refill:  0    Order Specific Question:   Supervising Provider    Answer:   Sabra Heck, BRIAN [[id number]]   tamsulosin (FLOMAX) 0.4 MG CAPS capsule    Sig: Take 1 capsule (0.4 mg total) by mouth daily.    Dispense:  30 capsule    Refill:  0    Order Specific Question:   Supervising Provider    Answer:   Sabra Heck, BRIAN [[id number]]   oxyCODONE-acetaminophen (PERCOCET/ROXICET) 5-325 MG tablet    Sig: Take 1 tablet by mouth every 6 (six) hours as needed for severe  pain.    Dispense:  15 tablet    Refill:  0    Order Specific Question:   Supervising Provider    Answer:   Sabra Heck, BRIAN [[id number]]    -I have reviewed the patients home medicines and have made adjustments as needed  Reevaluation: After the interventions noted above, I reevaluated the patient and found that they have :improved  Co morbidities that complicate the patient evaluation  Past Medical History:  Diagnosis Date   Anxiety    Arthritis    right shoulder   Cough 04/17/2013   to start antibiotic 04/17/2013   GERD (gastroesophageal reflux disease)    Hypoglycemia    Narcolepsy    PONV (postoperative nausea and vomiting)    Rotator cuff tear 03/2013   right   Shoulder joint pain 08-2011   Sinus headache       Dispostion: Patient with good pain control.  He is appropriate for discharge.  Discharged in stable condition.  Return precautions discussed.  Final Clinical Impression(s) / ED Diagnoses Final diagnoses:  Ureterolithiasis  Acute cystitis with hematuria    Rx / DC Orders ED Discharge Orders          Ordered    cephALEXin (KEFLEX) 500 MG capsule  3 times daily       Note to Pharmacy: Discussion had with patient.  Patient would like to continue with Keflex.  His reaction to penicillin was when he was a kid.   04/19/22 2125    ondansetron (ZOFRAN-ODT) 4 MG disintegrating tablet  Every 8 hours PRN        04/19/22 2125    tamsulosin (FLOMAX) 0.4 MG CAPS capsule  Daily        04/19/22 2125    oxyCODONE-acetaminophen (PERCOCET/ROXICET) 5-325 MG tablet  Every 6  hours PRN        04/19/22 2125              Evlyn Courier, PA-C 04/19/22 2200    Jeanell Sparrow, DO 04/23/22 [phone]

## 2022-04-19 NOTE — ED Notes (Signed)
Needs IV

## 2022-04-19 NOTE — ED Provider Triage Note (Signed)
Emergency Medicine Provider Triage Evaluation Note  Frank Moyer , a 41 y.o. male  was evaluated in triage.  Pt complains of RLQ pain since last night with emesis. The patient reports that he was urinating blood earlier in the ER, but was not able to get a sample. Denies any coffee ground emesis or hematemesis. Denies any fevers. Recent cholecystectomy on 03/13/22.  Review of Systems  Positive:  Negative:   Physical Exam  BP (!) 159/99   Pulse 61   Temp 98.8 F (37.1 C) (Oral)   Resp 16   SpO2 96%  Gen:   Awake, no distress   Resp:  Normal effort  MSK:   Moves extremities without difficulty  Other:  Mild RLQ abdominal pain  Medical Decision Making  Medically screening exam initiated at 4:02 PM.  Appropriate orders placed.  Frank Moyer was informed that the remainder of the evaluation will be completed by another provider, this initial triage assessment does not replace that evaluation, and the importance of remaining in the ED until their evaluation is complete.  Will order CT and labs.   Frank Moyer, [place] 04/19/22 1605

## 2022-04-20 ENCOUNTER — Other Ambulatory Visit: Payer: Self-pay | Admitting: Urology

## 2022-04-20 ENCOUNTER — Encounter (HOSPITAL_COMMUNITY)
Admission: RE | Disposition: A | Discharge status: Home / Self Care | Payer: Self-pay | Source: Ambulatory Visit | Attending: Urology

## 2022-04-20 ENCOUNTER — Ambulatory Visit (HOSPITAL_BASED_OUTPATIENT_CLINIC_OR_DEPARTMENT_OTHER): Payer: 59 | Admitting: Anesthesiology

## 2022-04-20 ENCOUNTER — Ambulatory Visit (HOSPITAL_COMMUNITY): Payer: 59

## 2022-04-20 ENCOUNTER — Encounter (HOSPITAL_COMMUNITY): Payer: Self-pay | Admitting: Urology

## 2022-04-20 ENCOUNTER — Ambulatory Visit (HOSPITAL_COMMUNITY)
Admission: RE | Admit: 2022-04-20 | Discharge: 2022-04-20 | Disposition: A | Discharge status: Home / Self Care | Payer: 59 | Source: Ambulatory Visit | Attending: Urology | Admitting: Urology

## 2022-04-20 ENCOUNTER — Ambulatory Visit (HOSPITAL_COMMUNITY): Payer: 59 | Admitting: Anesthesiology

## 2022-04-20 DIAGNOSIS — F419 Anxiety disorder, unspecified: Secondary | ICD-10-CM | POA: Diagnosis not present

## 2022-04-20 DIAGNOSIS — Z79899 Other long term (current) drug therapy: Secondary | ICD-10-CM | POA: Diagnosis not present

## 2022-04-20 DIAGNOSIS — M1990 Unspecified osteoarthritis, unspecified site: Secondary | ICD-10-CM | POA: Insufficient documentation

## 2022-04-20 DIAGNOSIS — F17200 Nicotine dependence, unspecified, uncomplicated: Secondary | ICD-10-CM | POA: Insufficient documentation

## 2022-04-20 DIAGNOSIS — K219 Gastro-esophageal reflux disease without esophagitis: Secondary | ICD-10-CM | POA: Insufficient documentation

## 2022-04-20 DIAGNOSIS — N201 Calculus of ureter: Secondary | ICD-10-CM | POA: Insufficient documentation

## 2022-04-20 HISTORY — PX: CYSTOSCOPY WITH STENT PLACEMENT: SHX5790

## 2022-04-20 SURGERY — CYSTOSCOPY, WITH STENT INSERTION
Anesthesia: General | Site: Urethra | Laterality: Right

## 2022-04-20 MED ORDER — KETOROLAC TROMETHAMINE 30 MG/ML IJ SOLN
INTRAMUSCULAR | Status: DC | PRN
  Administered 2022-04-20: 30 mg via INTRAVENOUS

## 2022-04-20 MED ORDER — PROPOFOL 10 MG/ML IV BOLUS
INTRAVENOUS | Status: DC | PRN
  Administered 2022-04-20 (×2): 100 mg via INTRAVENOUS
  Administered 2022-04-20: 150 mg via INTRAVENOUS
  Administered 2022-04-20: 50 mg via INTRAVENOUS

## 2022-04-20 MED ORDER — LACTATED RINGERS IV SOLN: INTRAVENOUS | Status: DC

## 2022-04-20 MED ORDER — FENTANYL CITRATE (PF) 100 MCG/2ML IJ SOLN
INTRAMUSCULAR | Status: DC | PRN
  Administered 2022-04-20: 100 ug via INTRAVENOUS

## 2022-04-20 MED ORDER — KETOROLAC TROMETHAMINE 30 MG/ML IJ SOLN: 30.0000 mg | Freq: Once | INTRAMUSCULAR | Status: DC | PRN

## 2022-04-20 MED ORDER — ORAL CARE MOUTH RINSE: 15.0000 mL | Freq: Once | OROMUCOSAL | Status: AC

## 2022-04-20 MED ORDER — ONDANSETRON HCL 4 MG/2ML IJ SOLN: 4.0000 mg | Freq: Once | INTRAMUSCULAR | Status: DC | PRN

## 2022-04-20 MED ORDER — MIDAZOLAM HCL 5 MG/5ML IJ SOLN
INTRAMUSCULAR | Status: DC | PRN
  Administered 2022-04-20: 2 mg via INTRAVENOUS

## 2022-04-20 MED ORDER — MEPERIDINE HCL 50 MG/ML IJ SOLN: 6.2500 mg | INTRAMUSCULAR | Status: DC | PRN

## 2022-04-20 MED ORDER — AMISULPRIDE (ANTIEMETIC) 5 MG/2ML IV SOLN: 10.0000 mg | Freq: Once | INTRAVENOUS | Status: DC | PRN

## 2022-04-20 MED ORDER — CHLORHEXIDINE GLUCONATE 0.12 % MT SOLN
15.0000 mL | Freq: Once | OROMUCOSAL | Status: AC
  Administered 2022-04-20: 15 mL via OROMUCOSAL

## 2022-04-20 MED ORDER — EPHEDRINE 5 MG/ML INJ
INTRAVENOUS | Status: AC
  Filled 2022-04-20: qty 10

## 2022-04-20 MED ORDER — ONDANSETRON HCL 4 MG/2ML IJ SOLN
INTRAMUSCULAR | Status: DC | PRN
  Administered 2022-04-20: 4 mg via INTRAVENOUS

## 2022-04-20 MED ORDER — FENTANYL CITRATE (PF) 100 MCG/2ML IJ SOLN
INTRAMUSCULAR | Status: AC
  Filled 2022-04-20: qty 2

## 2022-04-20 MED ORDER — PROPOFOL 10 MG/ML IV BOLUS
INTRAVENOUS | Status: AC
  Filled 2022-04-20: qty 20

## 2022-04-20 MED ORDER — OXYCODONE HCL 5 MG/5ML PO SOLN: 5.0000 mg | Freq: Once | ORAL | Status: DC | PRN

## 2022-04-20 MED ORDER — ACETAMINOPHEN 500 MG PO TABS
1000.0000 mg | ORAL_TABLET | Freq: Once | ORAL | Status: AC
  Administered 2022-04-20: 1000 mg via ORAL
  Filled 2022-04-20: qty 2

## 2022-04-20 MED ORDER — SODIUM CHLORIDE 0.9 % IR SOLN
Status: DC | PRN
  Administered 2022-04-20: 3000 mL

## 2022-04-20 MED ORDER — DEXAMETHASONE SODIUM PHOSPHATE 10 MG/ML IJ SOLN
INTRAMUSCULAR | Status: AC
  Filled 2022-04-20: qty 1

## 2022-04-20 MED ORDER — IOHEXOL 300 MG/ML  SOLN
INTRAMUSCULAR | Status: DC | PRN
  Administered 2022-04-20: 10 mL via URETHRAL

## 2022-04-20 MED ORDER — MIDAZOLAM HCL 2 MG/2ML IJ SOLN
INTRAMUSCULAR | Status: AC
  Filled 2022-04-20: qty 2

## 2022-04-20 MED ORDER — STERILE WATER FOR IRRIGATION IR SOLN: Status: DC | PRN

## 2022-04-20 MED ORDER — LIDOCAINE 2% (20 MG/ML) 5 ML SYRINGE
INTRAMUSCULAR | Status: DC | PRN
  Administered 2022-04-20: 60 mg via INTRAVENOUS

## 2022-04-20 MED ORDER — ONDANSETRON HCL 4 MG/2ML IJ SOLN
INTRAMUSCULAR | Status: AC
  Filled 2022-04-20: qty 2

## 2022-04-20 MED ORDER — HYDROMORPHONE HCL 1 MG/ML IJ SOLN: 0.2500 mg | INTRAMUSCULAR | Status: DC | PRN

## 2022-04-20 MED ORDER — OXYCODONE HCL 5 MG PO TABS: 5.0000 mg | ORAL_TABLET | Freq: Once | ORAL | Status: DC | PRN

## 2022-04-20 MED ORDER — PHENYLEPHRINE 80 MCG/ML (10ML) SYRINGE FOR IV PUSH (FOR BLOOD PRESSURE SUPPORT)
PREFILLED_SYRINGE | INTRAVENOUS | Status: AC
  Filled 2022-04-20: qty 10

## 2022-04-20 MED ORDER — CIPROFLOXACIN IN D5W 400 MG/200ML IV SOLN
400.0000 mg | INTRAVENOUS | Status: AC
  Administered 2022-04-20: 400 mg via INTRAVENOUS
  Filled 2022-04-20: qty 200

## 2022-04-20 MED ORDER — DEXAMETHASONE SODIUM PHOSPHATE 10 MG/ML IJ SOLN
INTRAMUSCULAR | Status: DC | PRN
  Administered 2022-04-20: 10 mg via INTRAVENOUS

## 2022-04-20 MED ORDER — LIDOCAINE HCL (PF) 2 % IJ SOLN
INTRAMUSCULAR | Status: AC
  Filled 2022-04-20: qty 5

## 2022-04-20 SURGICAL SUPPLY — 14 items
BAG URO CATCHER STRL LF (MISCELLANEOUS) ×1 IMPLANT
BULB IRRIG PATHFIND (MISCELLANEOUS) IMPLANT
CATH URETL OPEN END 6FR 70 (CATHETERS) ×1 IMPLANT
CLOTH BEACON ORANGE TIMEOUT ST (SAFETY) ×1 IMPLANT
GLOVE SURG LX STRL 7.5 STRW (GLOVE) ×1 IMPLANT
GOWN STRL REUS W/ TWL XL LVL3 (GOWN DISPOSABLE) ×1 IMPLANT
GOWN STRL REUS W/TWL XL LVL3 (GOWN DISPOSABLE) ×1
GUIDEWIRE STR DUAL SENSOR (WIRE) ×1 IMPLANT
MANIFOLD NEPTUNE II (INSTRUMENTS) ×1 IMPLANT
PACK CYSTO (CUSTOM PROCEDURE TRAY) ×1 IMPLANT
STENT URET 6FRX26 CONTOUR (STENTS) IMPLANT
SYR 20ML LL LF (SYRINGE) ×1 IMPLANT
TUBING CONNECTING 10 (TUBING) ×1 IMPLANT
TUBING UROLOGY SET (TUBING) IMPLANT

## 2022-04-20 NOTE — Anesthesia Preprocedure Evaluation (Addendum)
Anesthesia Evaluation  Patient identified by MRN, date of birth, ID band Patient awake    Reviewed: Allergy & Precautions, NPO status , Patient's Chart, lab work & pertinent test results  History of Anesthesia Complications (+) PONV and history of anesthetic complications  Airway Mallampati: I  TM Distance: >3 FB Neck ROM: Full    Dental  (+) Teeth Intact, Dental Advisory Given   Pulmonary Current Smoker,  25 pack year history, 1 ppd Smoked today  Albuterol inhaler about once a month with seasonal triggers   Pulmonary exam normal breath sounds clear to auscultation       Cardiovascular negative cardio ROS Normal cardiovascular exam Rhythm:Regular Rate:Normal     Neuro/Psych  Headaches, PSYCHIATRIC DISORDERS Anxiety    GI/Hepatic Neg liver ROS, GERD  Controlled,  Endo/Other  negative endocrine ROS  Renal/GU Renal disease  negative genitourinary   Musculoskeletal  (+) Arthritis , Osteoarthritis,    Abdominal   Peds  Hematology negative hematology ROS (+) Hb 15   Anesthesia Other Findings   Reproductive/Obstetrics negative OB ROS                            Anesthesia Physical Anesthesia Plan  ASA: 2  Anesthesia Plan: General   Post-op Pain Management: Tylenol PO (pre-op)* and Toradol IV (intra-op)*   Induction: Intravenous  PONV Risk Score and Plan: 3 and Ondansetron, Dexamethasone, Midazolam and Treatment may vary due to age or medical condition  Airway Management Planned: LMA  Additional Equipment: None  Intra-op Plan:   Post-operative Plan: Extubation in OR  Informed Consent: I have reviewed the patients History and Physical, chart, labs and discussed the procedure including the risks, benefits and alternatives for the proposed anesthesia with the patient or authorized representative who has indicated his/her understanding and acceptance.     Dental advisory  given  Plan Discussed with: CRNA  Anesthesia Plan Comments:        Anesthesia Quick Evaluation

## 2022-04-20 NOTE — Anesthesia Procedure Notes (Signed)
Procedure Name: LMA Insertion Date/Time: 04/20/2022 6:30 PM  Performed by: Gean Maidens, CRNAPre-anesthesia Checklist: Patient identified, Emergency Drugs available, Suction available, Patient being monitored and Timeout performed Patient Re-evaluated:Patient Re-evaluated prior to induction Oxygen Delivery Method: Circle system utilized Preoxygenation: Pre-oxygenation with 100% oxygen Induction Type: IV induction Ventilation: Mask ventilation without difficulty LMA: LMA inserted LMA Size: 4.0 Number of attempts: 1 Placement Confirmation: positive ETCO2 and breath sounds checked- equal and bilateral Tube secured with: Tape Dental Injury: Teeth and Oropharynx as per pre-operative assessment

## 2022-04-20 NOTE — Transfer of Care (Signed)
Immediate Anesthesia Transfer of Care Note  Patient: Frank Moyer  Procedure(s) Performed: CYSTOSCOPY WITH STENT PLACEMENT (Right: Urethra)  Patient Location: PACU  Anesthesia Type:General  Level of Consciousness: drowsy  Airway & Oxygen Therapy: Patient Spontanous Breathing and Patient connected to face mask oxygen  Post-op Assessment: Report given to RN and Post -op Vital signs reviewed and stable  Post vital signs: Reviewed and stable  Last Vitals:  Vitals Value Taken Time  BP 108/71 04/20/22 1851  Temp    Pulse 57 04/20/22 1853  Resp 16 04/20/22 1853  SpO2 96 % 04/20/22 1853  Vitals shown include unvalidated device data.  Last Pain:  Vitals:   04/20/22 1501  TempSrc: Oral  PainSc:       Patients Stated Pain Goal: 3 (63/84/53 6468)  Complications: No notable events documented.

## 2022-04-20 NOTE — H&P (Signed)
Patient is a 41 year old white male seen in the emergency room yesterday with cute onset of right sided flank pain. Ultimately had CT urogram which confirmed a 10 mm right UPJ stone with moderate hydronephrosis. Dr. Junious Silk was consulted and recommended patient follow-up here at the office today. He is now being seen for follow-up of his right UPJ stone and flank pain. See CT report below. Patient continues to have significant right-sided flank pain and nausea.  Urinalysis shows 40-60 RBCs   CLINICAL DATA: Pain right lower quadrant, hematuria     EXAM:  CT ABDOMEN AND PELVIS WITH CONTRAST     TECHNIQUE:  Multidetector CT imaging of the abdomen and pelvis was performed  using the standard protocol following bolus administration of  intravenous contrast.     RADIATION DOSE REDUCTION: This exam was performed according to the  departmental dose-optimization program which includes automated  exposure control, adjustment of the mA and/or kV according to  patient size and/or use of iterative reconstruction technique.     CONTRAST: 178m OMNIPAQUE IOHEXOL 300 MG/ML SOLN     COMPARISON: None Available.     FINDINGS:  Lower chest: Unremarkable.     Hepatobiliary: There is fatty infiltration in liver. There is no  dilation of bile ducts. Gallbladder is not seen consistent with  cholecystectomy.     Pancreas: No focal abnormalities are seen.     Spleen: Unremarkable.     Adrenals/Urinary Tract: Adrenals are unremarkable. There is mild to  moderate right hydronephrosis. There is 10 mm calculus at the right  ureteropelvic junction. There is 10 mm calculus in the lower pole of  left kidney. There is possible tiny 1 mm calculi in right kidney.  There is no hydronephrosis in the left kidney. Urinary bladder is  not distended.     Stomach/Bowel: Small hiatal hernia is seen. Stomach is not  distended. Small bowel loops are not dilated. Appendix is not  dilated. There is mild diffuse wall  thickening in colon. There is no  pericolic stranding or fluid collection.     Vascular/Lymphatic: Few scattered arterial calcifications are seen.  There are subcentimeter nodes seen retroperitoneum and mesentery  suggesting reactive hyperplasia.     Reproductive: Unremarkable.     Other: There is no ascites or pneumoperitoneum.     Musculoskeletal: Unremarkable.     IMPRESSION:  There is 10 mm calculus at the right ureteropelvic junction causing  mild to moderate right hydronephrosis. There is 10 mm left renal  calculus. There is possible tiny 1 mm calculus in the right kidney.     There is mild diffuse wall thickening in colon without pericolic  inflammation. This may suggest chronic nonspecific colitis.     There is no evidence of intestinal obstruction or pneumoperitoneum.  Appendix is not dilated. Fatty liver. Small hiatal hernia.        Electronically Signed  By: PElmer PickerM.D.  On: 04/19/2022 18:54     ALLERGIES: ADHD meds Antidepressants Penicillin    MEDICATIONS: Esomeprazole Magnesium 20 mg capsule,delayed release  Oxycodone-Acetaminophen 5 mg-325 mg tablet 1 tablet PO Daily  Rosuvastatin Calcium 10 mg tablet 1 tablet PO Daily  Sucralfate 1 gram tablet 1 tablet PO Daily  Tamsulosin Hcl 0.4 mg capsule 1 capsule PO Daily  Vitamin B12  Vitamin D3     GU PSH: None     PSH Notes: Bladder cyst removal   NON-GU PSH: Cholecystectomy (laparoscopic) Rotator cuff surgery, Right     GU PMH:  None   NON-GU PMH: Anxiety Depression Hypercholesterolemia    FAMILY HISTORY: Heart Attack - Father Lung Cancer - Uncle throat cancer - Mother   SOCIAL HISTORY: Marital Status: Married Preferred Language: English; Ethnicity: Not Hispanic Or Latino; Race: White Current Smoking Status: Patient smokes.   Tobacco Use Assessment Completed: Used Tobacco in last 30 days? Does not use smokeless tobacco. Has never drank.  Does not use drugs. Drinks 4+  caffeinated drinks per day.    REVIEW OF SYSTEMS:    GU Review Male:   Patient denies frequent urination, hard to postpone urination, burning/ pain with urination, get up at night to urinate, leakage of urine, stream starts and stops, trouble starting your stream, have to strain to urinate , erection problems, and penile pain.  Gastrointestinal (Upper):   Patient reports nausea and vomiting. Patient denies indigestion/ heartburn.  Gastrointestinal (Lower):   Patient denies diarrhea and constipation.  Constitutional:   Patient denies fever, night sweats, weight loss, and fatigue.  Skin:   Patient denies skin rash/ lesion and itching.  Eyes:   Patient denies blurred vision and double vision.  Ears/ Nose/ Throat:   Patient denies sore throat and sinus problems.  Hematologic/Lymphatic:   Patient denies swollen glands and easy bruising.  Cardiovascular:   Patient denies leg swelling and chest pains.  Respiratory:   Patient denies cough and shortness of breath.  Endocrine:   Patient denies excessive thirst.  Musculoskeletal:   Patient denies back pain and joint pain.  Neurological:   Patient denies headaches and dizziness.  Psychologic:   Patient denies anxiety and depression.   Notes: right abdominal pain; hematuria    VITAL SIGNS:      04/20/2022 10:54 AM  Weight 206 lb / 93.44 kg  Height 72 in / 182.88 cm  BP 132/86 mmHg  Pulse 65 /min  Temperature 97.1 F / 36.1 C  BMI 27.9 kg/m   MULTI-SYSTEM PHYSICAL EXAMINATION:    Constitutional: Well-nourished. No physical deformities. Normally developed. Good grooming.  Neck: Neck symmetrical, not swollen. Normal tracheal position.  Respiratory: No labored breathing, no use of accessory muscles.   Cardiovascular: Normal temperature, normal extremity pulses, no swelling, no varicosities.  Lymphatic: No enlargement of neck, axillae, groin.  Skin: No paleness, no jaundice, no cyanosis. No lesion, no ulcer, no rash.  Neurologic / Psychiatric:  Oriented to time, oriented to place, oriented to person. No depression, no anxiety, no agitation.  Gastrointestinal: No mass, no tenderness, no rigidity, non obese abdomen. Right-sided CVA tenderness noted  Eyes: Normal conjunctivae. Normal eyelids.  Ears, Nose, Mouth, and Throat: Left ear no scars, no lesions, no masses. Right ear no scars, no lesions, no masses. Nose no scars, no lesions, no masses. Normal hearing. Normal lips.  Musculoskeletal: Normal gait and station of head and neck.     Complexity of Data:  Source Of History:  Patient  Records Review:   Previous Hospital Records, Previous Patient Records  Urine Test Review:   Urinalysis  X-Ray Review: C.T. Abdomen/Pelvis: Reviewed Films. Reviewed Report. Discussed With Patient.     PROCEDURES:          Urinalysis w/Scope - 81001 Dipstick Dipstick Cont'd Micro  Color: Red Bilirubin: Neg mg/dL WBC/hpf: NS (Not Seen)  Appearance: Cloudy Ketones: Neg mg/dL RBC/hpf: 40 - 60/hpf  Specific Gravity: 1.015 Blood: 3+ ery/uL Bacteria: Few (10-25/hpf)  pH: 6.5 Protein: Trace mg/dL Cystals: NS (Not Seen)  Glucose: Neg mg/dL Urobilinogen: 0.2 mg/dL Casts: NS (Not Seen)  Nitrites: Neg Trichomonas: Not Present    Leukocyte Esterase: Trace leu/uL Mucous: Not Present      Epithelial Cells: NS (Not Seen)      Yeast: NS (Not Seen)      Sperm: Not Present    Notes: Unspun micro due to increased RBC's.    ASSESSMENT:      ICD-10 Details  1 GU:   Ureteral obstruction secondary to calculous - J73.6 Acute, Complicated Injury   PLAN:           Document Letter(s):  Created for Patient: Clinical Summary         Notes:   Discussed CT findings with the patient. Recommended cystoscopy insertion of right JJ stent. We will then proceed with ESL hopefully next week. Risk and benefits of that procedure were discussed in detail today.  I have discussed with the patient the risks and consequences of the procedure of extracorporeal shockwave  lithotripsy to include, but not limited to: Bleeding, including bleeding in the urine, bleeding around the kidney with hematoma formation and rarely bleeding to the point of loss of the kidney, infection, damage to the surrounding structures including soft tissue and bowel perforation, residual stone fragments requiring the need for future treatments including endoscopic surgery, open surgery or percutaneous surgery. I have emphasized that study showed that up to 25% of patients will require additional procedures depending on stone size and composition. I have also discussed with the patient that the success rate for ESWL is approximately 60-90% and depends on stone location and stone composition as well as preoperative stone size. The patient voices understanding of the risks and benefits of the above and consents to the procedure.

## 2022-04-20 NOTE — Anesthesia Postprocedure Evaluation (Signed)
Anesthesia Post Note  Patient: Frank Moyer  Procedure(s) Performed: CYSTOSCOPY WITH STENT PLACEMENT (Right: Urethra)     Patient location during evaluation: PACU Anesthesia Type: General Level of consciousness: awake and alert, oriented and patient cooperative Pain management: pain level controlled Vital Signs Assessment: post-procedure vital signs reviewed and stable Respiratory status: spontaneous breathing, nonlabored ventilation and respiratory function stable Cardiovascular status: blood pressure returned to baseline and stable Postop Assessment: no apparent nausea or vomiting Anesthetic complications: no   No notable events documented.  Last Vitals:  Vitals:   04/20/22 1855 04/20/22 1900  BP:  118/76  Pulse: (!) 54 (!) 51  Resp: 15 14  Temp:    SpO2: 97% 100%    Last Pain:  Vitals:   04/20/22 1900  TempSrc:   PainSc: [place]

## 2022-04-20 NOTE — Op Note (Signed)
Preoperative diagnosis:  1.  Right UPJ stone with obstruction  Postoperative diagnosis: 1.  Same  Procedure(s): 1.  Cystoscopy, right retrograde pyelogram with intraoperative interpretation, right ureteral stone manipulation and insertion of right JJ stent  Surgeon: Dr. Timoth Barban  Anesthesia: General  Complications: None  EBL: Minimal  Specimens: None  Disposition of specimens: Not applicable  Intraoperative findings: 8 mm right UVJ stone manipulated back to the renal pelvis.  6 [place] by 26 cm Percuflex plus soft contour stent placed without difficulty no tether  Indication: 41 year old white male presented to the emergency room with severe right-sided flank pain.  Found to have an 8 mm right UPJ stone with obstruction.  Presents at this time undergo cystoscopy insertion right JJ stent in preparation for upcoming lithotripsy.  Description of procedure:  After obtaining informed consent the patient was taken the major cystoscopy suite placed under general anesthesia.  Placed in the dorsolithotomy position genitalia prepped draped in usual sterile fashion.  Proper pause and timeout performed for site of procedure.  57 French cystoscope was advanced in the bladder no mucosal lesions noted.  The right ureteral orifice was notified cannulated with a 5 French open tip catheter.  Retrograde pyelogram was performed confirmed filling defect at the right UPJ.  I advanced the abdomen catheter up to the level of the stone and this easily manipulated back into the renal pelvis.  Retrograde pyelogram confirmed filling defect consistent with a stone.  Wire was placed through the open tip catheter and the open tip catheter removed.  6 [place] by 26 cm Percuflex plus soft contour stent was placed leaving a proximal coil in the renal pelvis and distal coil in the bladder.  Bladder was emptied procedure terminated he was awakened from anesthesia and taken back to recovery in stable condition no  immediate complication from the procedure.

## 2022-04-21 ENCOUNTER — Encounter (HOSPITAL_COMMUNITY): Payer: Self-pay | Admitting: Urology

## 2022-04-21 LAB — URINE CULTURE: Culture: NO GROWTH

## 2022-04-26 ENCOUNTER — Encounter (HOSPITAL_BASED_OUTPATIENT_CLINIC_OR_DEPARTMENT_OTHER): Payer: Self-pay | Admitting: Urology

## 2022-04-26 NOTE — Progress Notes (Signed)
Reviewed instructions for lithotripsy with patient's spouse- [name]. Instructed NPO after MN, arrival time of 0800. Take Nexium day of procedure with sip of water. Take laxative of choice day before procedure and stop all NSAIDS 48 hrs prior. She verbalized understanding and denied having any other questions.

## 2022-04-29 NOTE — H&P (Signed)
Patient is a 42 year old white male seen in the emergency room yesterday with cute onset of right sided flank pain. Ultimately had CT urogram which confirmed a 10 mm right UPJ stone with moderate hydronephrosis. Dr. Junious Silk was consulted and recommended patient follow-up here at the office today. He is now being seen for follow-up of his right UPJ stone and flank pain. See CT report below. Patient continues to have significant right-sided flank pain and nausea.  Urinalysis shows 40-60 RBCs   CLINICAL DATA: Pain right lower quadrant, hematuria     EXAM:  CT ABDOMEN AND PELVIS WITH CONTRAST     TECHNIQUE:  Multidetector CT imaging of the abdomen and pelvis was performed  using the standard protocol following bolus administration of  intravenous contrast.     RADIATION DOSE REDUCTION: This exam was performed according to the  departmental dose-optimization program which includes automated  exposure control, adjustment of the mA and/or kV according to  patient size and/or use of iterative reconstruction technique.     CONTRAST: 170m OMNIPAQUE IOHEXOL 300 MG/ML SOLN     COMPARISON: None Available.     FINDINGS:  Lower chest: Unremarkable.     Hepatobiliary: There is fatty infiltration in liver. There is no  dilation of bile ducts. Gallbladder is not seen consistent with  cholecystectomy.     Pancreas: No focal abnormalities are seen.     Spleen: Unremarkable.     Adrenals/Urinary Tract: Adrenals are unremarkable. There is mild to  moderate right hydronephrosis. There is 10 mm calculus at the right  ureteropelvic junction. There is 10 mm calculus in the lower pole of  left kidney. There is possible tiny 1 mm calculi in right kidney.  There is no hydronephrosis in the left kidney. Urinary bladder is  not distended.     Stomach/Bowel: Small hiatal hernia is seen. Stomach is not  distended. Small bowel loops are not dilated. Appendix is not  dilated. There is mild diffuse wall  thickening in colon. There is no  pericolic stranding or fluid collection.     Vascular/Lymphatic: Few scattered arterial calcifications are seen.  There are subcentimeter nodes seen retroperitoneum and mesentery  suggesting reactive hyperplasia.     Reproductive: Unremarkable.     Other: There is no ascites or pneumoperitoneum.     Musculoskeletal: Unremarkable.     IMPRESSION:  There is 10 mm calculus at the right ureteropelvic junction causing  mild to moderate right hydronephrosis. There is 10 mm left renal  calculus. There is possible tiny 1 mm calculus in the right kidney.     There is mild diffuse wall thickening in colon without pericolic  inflammation. This may suggest chronic nonspecific colitis.     There is no evidence of intestinal obstruction or pneumoperitoneum.  Appendix is not dilated. Fatty liver. Small hiatal hernia.        Electronically Signed  By: PElmer PickerM.D.  On: 04/19/2022 18:54     ALLERGIES: ADHD meds Antidepressants Penicillin    MEDICATIONS: Esomeprazole Magnesium 20 mg capsule,delayed release  Oxycodone-Acetaminophen 5 mg-325 mg tablet 1 tablet PO Daily  Rosuvastatin Calcium 10 mg tablet 1 tablet PO Daily  Sucralfate 1 gram tablet 1 tablet PO Daily  Tamsulosin Hcl 0.4 mg capsule 1 capsule PO Daily  Vitamin B12  Vitamin D3     GU PSH: None     PSH Notes: Bladder cyst removal   NON-GU PSH: Cholecystectomy (laparoscopic) Rotator cuff surgery, Right     GU PMH:  None   NON-GU PMH: Anxiety Depression Hypercholesterolemia    FAMILY HISTORY: Heart Attack - Father Lung Cancer - Uncle throat cancer - Mother   SOCIAL HISTORY: Marital Status: Married Preferred Language: English; Ethnicity: Not Hispanic Or Latino; Race: White Current Smoking Status: Patient smokes.   Tobacco Use Assessment Completed: Used Tobacco in last 30 days? Does not use smokeless tobacco. Has never drank.  Does not use drugs. Drinks 4+  caffeinated drinks per day.    REVIEW OF SYSTEMS:    GU Review Male:   Patient denies frequent urination, hard to postpone urination, burning/ pain with urination, get up at night to urinate, leakage of urine, stream starts and stops, trouble starting your stream, have to strain to urinate , erection problems, and penile pain.  Gastrointestinal (Upper):   Patient reports nausea and vomiting. Patient denies indigestion/ heartburn.  Gastrointestinal (Lower):   Patient denies diarrhea and constipation.  Constitutional:   Patient denies fever, night sweats, weight loss, and fatigue.  Skin:   Patient denies skin rash/ lesion and itching.  Eyes:   Patient denies blurred vision and double vision.  Ears/ Nose/ Throat:   Patient denies sore throat and sinus problems.  Hematologic/Lymphatic:   Patient denies swollen glands and easy bruising.  Cardiovascular:   Patient denies leg swelling and chest pains.  Respiratory:   Patient denies cough and shortness of breath.  Endocrine:   Patient denies excessive thirst.  Musculoskeletal:   Patient denies back pain and joint pain.  Neurological:   Patient denies headaches and dizziness.  Psychologic:   Patient denies anxiety and depression.   Notes: right abdominal pain; hematuria    VITAL SIGNS:      04/20/2022 10:54 AM  Weight 206 lb / 93.44 kg  Height 72 in / 182.88 cm  BP 132/86 mmHg  Pulse 65 /min  Temperature 97.1 F / 36.1 C  BMI 27.9 kg/m   MULTI-SYSTEM PHYSICAL EXAMINATION:    Constitutional: Well-nourished. No physical deformities. Normally developed. Good grooming.  Neck: Neck symmetrical, not swollen. Normal tracheal position.  Respiratory: No labored breathing, no use of accessory muscles.   Cardiovascular: Normal temperature, normal extremity pulses, no swelling, no varicosities.  Lymphatic: No enlargement of neck, axillae, groin.  Skin: No paleness, no jaundice, no cyanosis. No lesion, no ulcer, no rash.  Neurologic / Psychiatric:  Oriented to time, oriented to place, oriented to person. No depression, no anxiety, no agitation.  Gastrointestinal: No mass, no tenderness, no rigidity, non obese abdomen. Right-sided CVA tenderness noted  Eyes: Normal conjunctivae. Normal eyelids.  Ears, Nose, Mouth, and Throat: Left ear no scars, no lesions, no masses. Right ear no scars, no lesions, no masses. Nose no scars, no lesions, no masses. Normal hearing. Normal lips.  Musculoskeletal: Normal gait and station of head and neck.     Complexity of Data:  Source Of History:  Patient  Records Review:   Previous Hospital Records, Previous Patient Records  Urine Test Review:   Urinalysis  X-Ray Review: C.T. Abdomen/Pelvis: Reviewed Films. Reviewed Report. Discussed With Patient.     PROCEDURES:          Urinalysis w/Scope - 81001 Dipstick Dipstick Cont'd Micro  Color: Red Bilirubin: Neg mg/dL WBC/hpf: NS (Not Seen)  Appearance: Cloudy Ketones: Neg mg/dL RBC/hpf: 40 - 60/hpf  Specific Gravity: 1.015 Blood: 3+ ery/uL Bacteria: Few (10-25/hpf)  pH: 6.5 Protein: Trace mg/dL Cystals: NS (Not Seen)  Glucose: Neg mg/dL Urobilinogen: 0.2 mg/dL Casts: NS (Not Seen)  Nitrites: Neg Trichomonas: Not Present    Leukocyte Esterase: Trace leu/uL Mucous: Not Present      Epithelial Cells: NS (Not Seen)      Yeast: NS (Not Seen)      Sperm: Not Present    Notes: Unspun micro due to increased RBC's.    ASSESSMENT:      ICD-10 Details  1 GU:   Ureteral obstruction secondary to calculous - B35.3 Acute, Complicated Injury   PLAN:           Document Letter(s):  Created for Patient: Clinical Summary         Notes:   Discussed CT findings with the patient. Recommended cystoscopy insertion of right JJ stent. We will then proceed with ESL hopefully next week. Risk and benefits of that procedure were discussed in detail today.  I have discussed with the patient the risks and consequences of the procedure of extracorporeal shockwave  lithotripsy to include, but not limited to: Bleeding, including bleeding in the urine, bleeding around the kidney with hematoma formation and rarely bleeding to the point of loss of the kidney, infection, damage to the surrounding structures including soft tissue and bowel perforation, residual stone fragments requiring the need for future treatments including endoscopic surgery, open surgery or percutaneous surgery. I have emphasized that study showed that up to 25% of patients will require additional procedures depending on stone size and composition. I have also discussed with the patient that the success rate for ESWL is approximately 60-90% and depends on stone location and stone composition as well as preoperative stone size. The patient voices understanding of the risks and benefits of the above and consents to the procedure.

## 2022-04-30 ENCOUNTER — Encounter (HOSPITAL_BASED_OUTPATIENT_CLINIC_OR_DEPARTMENT_OTHER)
Admission: RE | Disposition: A | Discharge status: Home / Self Care | Payer: Self-pay | Source: Ambulatory Visit | Attending: Urology

## 2022-04-30 ENCOUNTER — Ambulatory Visit (HOSPITAL_COMMUNITY): Payer: 59

## 2022-04-30 ENCOUNTER — Encounter (HOSPITAL_BASED_OUTPATIENT_CLINIC_OR_DEPARTMENT_OTHER): Payer: Self-pay | Admitting: Urology

## 2022-04-30 ENCOUNTER — Other Ambulatory Visit: Payer: Self-pay

## 2022-04-30 ENCOUNTER — Ambulatory Visit (HOSPITAL_COMMUNITY)
Admission: RE | Admit: 2022-04-30 | Discharge: 2022-04-30 | Disposition: A | Discharge status: Home / Self Care | Payer: 59 | Source: Ambulatory Visit | Attending: Urology | Admitting: Urology

## 2022-04-30 DIAGNOSIS — G4730 Sleep apnea, unspecified: Secondary | ICD-10-CM | POA: Diagnosis not present

## 2022-04-30 DIAGNOSIS — J45909 Unspecified asthma, uncomplicated: Secondary | ICD-10-CM | POA: Diagnosis not present

## 2022-04-30 DIAGNOSIS — F17200 Nicotine dependence, unspecified, uncomplicated: Secondary | ICD-10-CM | POA: Insufficient documentation

## 2022-04-30 DIAGNOSIS — N132 Hydronephrosis with renal and ureteral calculous obstruction: Secondary | ICD-10-CM | POA: Insufficient documentation

## 2022-04-30 HISTORY — DX: Prediabetes: R73.03

## 2022-04-30 HISTORY — PX: EXTRACORPOREAL SHOCK WAVE LITHOTRIPSY: SHX1557

## 2022-04-30 SURGERY — LITHOTRIPSY, ESWL
Anesthesia: LOCAL | Laterality: Right

## 2022-04-30 MED ORDER — DIPHENHYDRAMINE HCL 25 MG PO CAPS
25.0000 mg | ORAL_CAPSULE | ORAL | Status: AC
  Administered 2022-04-30: 25 mg via ORAL

## 2022-04-30 MED ORDER — DIAZEPAM 5 MG PO TABS
10.0000 mg | ORAL_TABLET | ORAL | Status: AC
  Administered 2022-04-30: 10 mg via ORAL

## 2022-04-30 MED ORDER — DIAZEPAM 5 MG PO TABS
ORAL_TABLET | ORAL | Status: AC
  Filled 2022-04-30: qty 2

## 2022-04-30 MED ORDER — SODIUM CHLORIDE 0.9 % IV SOLN: INTRAVENOUS | Status: DC

## 2022-04-30 MED ORDER — DIPHENHYDRAMINE HCL 25 MG PO CAPS
ORAL_CAPSULE | ORAL | Status: AC
  Filled 2022-04-30: qty 1

## 2022-04-30 MED ORDER — SODIUM CHLORIDE 0.9% FLUSH: 3.0000 mL | Freq: Two times a day (BID) | INTRAVENOUS | Status: DC

## 2022-04-30 MED ORDER — CIPROFLOXACIN HCL 500 MG PO TABS
500.0000 mg | ORAL_TABLET | ORAL | Status: AC
  Administered 2022-04-30: 500 mg via ORAL

## 2022-04-30 MED ORDER — CIPROFLOXACIN HCL 500 MG PO TABS
ORAL_TABLET | ORAL | Status: AC
  Filled 2022-04-30: qty 1

## 2022-04-30 NOTE — Interval H&P Note (Signed)
History and Physical Interval Note:  Stent in place with the stone at the proximal loop on the right.   04/30/2022 10:11 AM  Frank Moyer  has presented today for surgery, with the diagnosis of RIGHT URETERAL STONE.  The various methods of treatment have been discussed with the patient and family. After consideration of risks, benefits and other options for treatment, the patient has consented to  Procedure(s): RIGHT EXTRACORPOREAL SHOCK WAVE LITHOTRIPSY (ESWL) (Right) as a surgical intervention.  The patient's history has been reviewed, patient examined, no change in status, stable for surgery.  I have reviewed the patient's chart and labs.  Questions were answered to the patient's satisfaction.     Irine Seal

## 2022-04-30 NOTE — Op Note (Signed)
See [place] note

## 2022-04-30 NOTE — Progress Notes (Signed)
Patient has red blotchy area on right upper flank from shockwave lithotripsy.

## 2022-05-01 ENCOUNTER — Encounter (HOSPITAL_BASED_OUTPATIENT_CLINIC_OR_DEPARTMENT_OTHER): Payer: Self-pay | Admitting: Urology

## 2023-01-22 ENCOUNTER — Other Ambulatory Visit: Payer: Self-pay | Admitting: Diagnostic Radiology

## 2023-01-22 ENCOUNTER — Encounter (HOSPITAL_BASED_OUTPATIENT_CLINIC_OR_DEPARTMENT_OTHER): Payer: Self-pay | Admitting: Urology

## 2023-01-22 ENCOUNTER — Other Ambulatory Visit: Payer: Self-pay | Admitting: Urology

## 2023-01-22 NOTE — Progress Notes (Signed)
Spoke with Frank Moyer, Mr. Crissmon's spouse to give instructions for upcoming lithotripsy. Reviewed allergies and she had a question regarding potential allergy to all opiods and intolerance to anti depressants, and possibly anti anxiolytics. She is concerned about what he can be given for pain control during procedure. Instructed her to call Dr. Thomos Lemons office and speak with her scheduler, or Dr. Arita Miss about the possibility of needing anesthesia present if patient is unable to tolerate the medications that we usually administer. Informed her that Dr. Arita Miss would have to determine the plan and if Anesthesia would be needed. Otherwise instructed per protocol. NPO after MN, stop all ASA today and all vitamins, supplements starting tomorrow. Arrive at 0645, ane appropriate clothing to wear. She will be his driver and caregiver. Verbalized understanding of all, and will call [facility] now.

## 2023-01-23 NOTE — Progress Notes (Signed)
Spoke with Pam (scheduler with [facility]) about pt/spouse concerns over medications to be given pre/in procedure and reported allergies to narcotics. Reaction reported as nausea/vomiting. Per Pam, office note for past lithotripsy 04/2022 reported vomiting after procedure "x1". No record of other reaction found. Office aware. To evaluate day of procedure.

## 2023-01-25 ENCOUNTER — Encounter (HOSPITAL_BASED_OUTPATIENT_CLINIC_OR_DEPARTMENT_OTHER): Payer: Self-pay | Admitting: Urology

## 2023-01-25 ENCOUNTER — Encounter (HOSPITAL_BASED_OUTPATIENT_CLINIC_OR_DEPARTMENT_OTHER)
Admission: RE | Disposition: A | Discharge status: Home / Self Care | Payer: Self-pay | Source: Home / Self Care | Attending: Urology

## 2023-01-25 ENCOUNTER — Ambulatory Visit (HOSPITAL_BASED_OUTPATIENT_CLINIC_OR_DEPARTMENT_OTHER)
Admission: RE | Admit: 2023-01-25 | Discharge: 2023-01-25 | Disposition: A | Discharge status: Home / Self Care | Payer: 59 | Attending: Urology | Admitting: Urology

## 2023-01-25 ENCOUNTER — Ambulatory Visit (HOSPITAL_COMMUNITY): Payer: 59

## 2023-01-25 DIAGNOSIS — F32A Depression, unspecified: Secondary | ICD-10-CM | POA: Insufficient documentation

## 2023-01-25 DIAGNOSIS — F419 Anxiety disorder, unspecified: Secondary | ICD-10-CM | POA: Diagnosis not present

## 2023-01-25 DIAGNOSIS — E7800 Pure hypercholesterolemia, unspecified: Secondary | ICD-10-CM | POA: Diagnosis not present

## 2023-01-25 DIAGNOSIS — Z79899 Other long term (current) drug therapy: Secondary | ICD-10-CM | POA: Diagnosis not present

## 2023-01-25 DIAGNOSIS — F17200 Nicotine dependence, unspecified, uncomplicated: Secondary | ICD-10-CM | POA: Diagnosis not present

## 2023-01-25 DIAGNOSIS — N201 Calculus of ureter: Secondary | ICD-10-CM | POA: Diagnosis present

## 2023-01-25 HISTORY — PX: EXTRACORPOREAL SHOCK WAVE LITHOTRIPSY: SHX1557

## 2023-01-25 LAB — GLUCOSE, CAPILLARY: Glucose-Capillary: 134 mg/dL — ABNORMAL HIGH (ref 70–99)

## 2023-01-25 SURGERY — LITHOTRIPSY, ESWL
Anesthesia: LOCAL | Laterality: Left

## 2023-01-25 MED ORDER — SCOPOLAMINE 1 MG/3DAYS TD PT72: 1.0000 | MEDICATED_PATCH | TRANSDERMAL | Status: DC

## 2023-01-25 MED ORDER — CIPROFLOXACIN HCL 500 MG PO TABS
ORAL_TABLET | ORAL | Status: AC
  Filled 2023-01-25: qty 1

## 2023-01-25 MED ORDER — DIPHENHYDRAMINE HCL 25 MG PO CAPS
ORAL_CAPSULE | ORAL | Status: AC
  Filled 2023-01-25: qty 1

## 2023-01-25 MED ORDER — DIAZEPAM 5 MG PO TABS
10.0000 mg | ORAL_TABLET | ORAL | Status: AC
  Administered 2023-01-25: 10 mg via ORAL

## 2023-01-25 MED ORDER — SCOPOLAMINE 1 MG/3DAYS TD PT72
MEDICATED_PATCH | TRANSDERMAL | Status: AC
  Filled 2023-01-25: qty 1

## 2023-01-25 MED ORDER — CIPROFLOXACIN HCL 500 MG PO TABS
500.0000 mg | ORAL_TABLET | Freq: Once | ORAL | Status: AC
  Administered 2023-01-25: 500 mg via ORAL

## 2023-01-25 MED ORDER — DIAZEPAM 5 MG PO TABS
ORAL_TABLET | ORAL | Status: AC
  Filled 2023-01-25: qty 2

## 2023-01-25 MED ORDER — SODIUM CHLORIDE 0.9 % IV SOLN: INTRAVENOUS | Status: DC

## 2023-01-25 MED ORDER — DIPHENHYDRAMINE HCL 25 MG PO CAPS
25.0000 mg | ORAL_CAPSULE | ORAL | Status: AC
  Administered 2023-01-25: 25 mg via ORAL

## 2023-01-25 NOTE — Discharge Instructions (Addendum)
See [facility] discharge instructions in chart. ° ° °Post Anesthesia Home Care Instructions ° °Activity: °Get plenty of rest for the remainder of the day. A responsible individual must stay with you for 24 hours following the procedure.  °For the next 24 hours, DO NOT: °-Drive a car °-Operate machinery °-Drink alcoholic beverages °-Take any medication unless instructed by your physician °-Make any legal decisions or sign important papers. ° °Meals: °Start with liquid foods such as gelatin or soup. Progress to regular foods as tolerated. Avoid greasy, spicy, heavy foods. If nausea and/or vomiting occur, drink only clear liquids until the nausea and/or vomiting subsides. Call your physician if vomiting continues. ° °Special Instructions/Symptoms: °Your throat may feel dry or sore from the anesthesia or the breathing tube placed in your throat during surgery. If this causes discomfort, gargle with warm salt water. The discomfort should disappear within 24 hours. ° °If you had a scopolamine patch placed behind your ear for the management of post- operative nausea and/or vomiting: ° °1. The medication in the patch is effective for 72 hours, after which it should be removed.  Wrap patch in a tissue and discard in the trash. Wash hands thoroughly with soap and water. °2. You may remove the patch earlier than 72 hours if you experience unpleasant side effects which may include dry mouth, dizziness or visual disturbances. °3. Avoid touching the patch. Wash your hands with soap and water after contact with the patch. °   ° °

## 2023-01-25 NOTE — H&P (Signed)
CC/HPI: Patient is a 42 year old white male seen in the emergency room yesterday with cute onset of right sided flank pain. Ultimately had CT urogram which confirmed a 10 mm right UPJ stone with moderate hydronephrosis. Dr. Mena Goes was consulted and recommended patient follow-up here at the office today. He is now being seen for follow-up of his right UPJ stone and flank pain. See CT report below. Patient continues to have significant right-sided flank pain and nausea.  Urinalysis shows 40-60 RBCs   05/14/2022: Patient underwent right ureteral stenting after last office visit. He then followed up for definitive shockwave lithotripsy to treat the previously identified obstructing right ureteral calculus on 10/2. Now back today for follow-up exam and KUB with possible cystoscopy and stent removal. Pt has passed numerous small stone fragments and sand like material since the procedure. Pain has been well controlled. He does endorse some expected irritative symptoms including frequency/urgency, intermittency of stream as well as some mild dysuria and gross hematuria. He has not been having any nausea/vomiting, denies postprocedure fever/chills.   06/29/2022: Uncomplicated stent removal at time of last office visit. Now back today for follow-up exam with KUB and right renal ultrasound. Overall doing well, no interval stone material passage or recurrence of pain/discomfort suggestive of obstructive uropathy. He is voiding at his baseline with grossly stable nonbothersome symptomology including absence of any dysuria and gross hematuria. He has been making more efforts at fluid and dietary modification with increased hydration and adding citrus juices to his water.   01/21/2023: Seen today for evaluation of possible KS event. Approximately 3 days ago he had severe left lower back and flank pain/discomfort with some associated nausea and vomiting. [place] the [place] and took some a combination of Tylenol and  ibuprofen with complete resolution of the pain and discomfort. He has not had any additional pain and discomfort since then. He is voiding at his baseline without any changes in force of stream, dysuria or gross hematuria. He denies seeing any stone material passed. The nausea and vomiting has resolved. No associated fevers and chills. UA today contains microscopic hematuria.     ALLERGIES: ADHD meds Antidepressants Penicillin    MEDICATIONS: Esomeprazole Magnesium 20 mg capsule,delayed release  Iron occasional  Rosuvastatin Calcium 10 mg tablet 1 tablet PO Daily  Sucralfate 1 gram tablet 1 tablet PO Daily  Vitamin B12  Vitamin D3     GU PSH: Cysto Remove Stent FB Sim - 05/14/2022 ESWL - 04/30/2022       PSH Notes: Bladder cyst removal   NON-GU PSH: Cholecystectomy (laparoscopic) Rotator cuff surgery, Right     GU PMH: Renal calculus - 06/29/2022, - 05/14/2022 Ureteral calculus - 06/29/2022, - 05/14/2022 Ureteral obstruction secondary to calculous - 04/20/2022    NON-GU PMH: Anxiety Depression Hypercholesterolemia    FAMILY HISTORY: Heart Attack - Father Lung Cancer - Uncle throat cancer - Mother   SOCIAL HISTORY: Marital Status: Married Preferred Language: English; Ethnicity: Not Hispanic Or Latino; Race: White Current Smoking Status: Patient smokes.   Tobacco Use Assessment Completed: Used Tobacco in last 30 days? Does not use smokeless tobacco. Has never drank.  Does not use drugs. Drinks 4+ caffeinated drinks per day.    REVIEW OF SYSTEMS:    GU Review Male:   Patient reports get up at night to urinate. Patient denies frequent urination, hard to postpone urination, burning/ pain with urination, leakage of urine, stream starts and stops, trouble starting your stream, have to strain to urinate ,  erection problems, and penile pain.  Gastrointestinal (Upper):   Patient reports nausea and vomiting. Patient denies indigestion/ heartburn.  Gastrointestinal (Lower):    Patient denies diarrhea and constipation.  Constitutional:   Patient denies fever, night sweats, weight loss, and fatigue.  Skin:   Patient denies skin rash/ lesion and itching.  Eyes:   Patient denies double vision and blurred vision.  Ears/ Nose/ Throat:   Patient denies sore throat and sinus problems.  Hematologic/Lymphatic:   Patient denies swollen glands and easy bruising.  Cardiovascular:   Patient denies leg swelling and chest pains.  Respiratory:   Patient denies cough and shortness of breath.  Endocrine:   Patient denies excessive thirst.  Musculoskeletal:   Patient reports back pain. Patient denies joint pain.  Neurological:   Patient denies headaches and dizziness.  Psychologic:   Patient denies depression and anxiety.   VITAL SIGNS:      01/21/2023 09:08 AM  Weight 195 lb / 88.45 kg  BP 124/86 mmHg  Pulse 69 /min  Temperature 98.2 F / 36.7 C   MULTI-SYSTEM PHYSICAL EXAMINATION:    Constitutional: Well-nourished. No physical deformities. Normally developed. Good grooming.  Neck: Neck symmetrical, not swollen. Normal tracheal position.  Respiratory: No labored breathing, no use of accessory muscles.   Cardiovascular: Normal temperature, normal extremity pulses, no swelling, no varicosities.  Skin: No paleness, no jaundice, no cyanosis. No lesion, no ulcer, no rash.  Neurologic / Psychiatric: Oriented to time, oriented to place, oriented to person. No depression, no anxiety, no agitation.  Gastrointestinal: No mass, no tenderness, no rigidity, non obese abdomen. No CVA or flank tenderness  Musculoskeletal: Normal gait and station of head and neck.     Complexity of Data:  Lab Test Review:   Stone Analysis  Records Review:   Previous Doctor Records, Previous Hospital Records, Previous Patient Records  Urine Test Review:   Urinalysis, Urine Culture  X-Ray Review: KUB: Reviewed Films. Discussed With Patient.  Renal Ultrasound: Reviewed Films.  C.T. Abdomen/Pelvis: Reviewed  Films. Reviewed Report.     PROCEDURES:         KUB - [id number]  A single view of the abdomen is obtained. Previously identified stone within the left kidney has now migrated into the left proximal ureter measuring approximately 7 mm identified between left L2 and L3 transverse process.      . Patient confirmed No Neulasta OnPro Device.           Urinalysis w/Scope Dipstick Dipstick Cont'd Micro  Color: Amber Bilirubin: Neg mg/dL WBC/hpf: NS (Not Seen)  Appearance: Clear Ketones: Neg mg/dL RBC/hpf: 10 - 96/EAV  Specific Gravity: 1.025 Blood: 2+ ery/uL Bacteria: Few (10-25/hpf)  pH: 6.0 Protein: Neg mg/dL Cystals: NS (Not Seen)  Glucose: Neg mg/dL Urobilinogen: 0.2 mg/dL Casts: NS (Not Seen)    Nitrites: Neg Trichomonas: Not Present    Leukocyte Esterase: Neg leu/uL Mucous: Present      Epithelial Cells: NS (Not Seen)      Yeast: NS (Not Seen)      Sperm: Not Present    ASSESSMENT:      ICD-10 Details  1 GU:   Ureteral calculus - N20.1 Undiagnosed New Problem  2   Flank Pain - R10.84 Left, Undiagnosed New Problem   PLAN:            Medications New Meds: Tamsulosin Hcl 0.4 mg capsule 1 capsule PO Daily   #30  2 Refill(s)  Ondansetron Odt 4 mg tablet,disintegrating 1 tablet  PO Q 6 H PRN Take for nausea/vomiting  #20  0 Refill(s)  Tramadol Hcl 50 mg tablet 1-2 tablet PO Q 6 H PRN   #20  0 Refill(s)  Pharmacy Name:  [id number]  Address:  [address]   [place], [place] [id number]  Phone:  [phone]  Fax:  [phone]    Stop Meds: Promethazine Hcl 25 mg suppository, rectal 1 suppository PR Q 6 H PRN prn nausea Start: 05/01/2022  Discontinue: 01/21/2023  - Reason: The medication cycle was completed.  Oxycodone-Acetaminophen 5 mg-325 mg tablet 1 tablet PO Daily  Discontinue: 01/21/2023  - Reason: The medication cycle was completed.            Orders Labs Urine Culture  X-Rays: KUB          Schedule Return Visit/Planned Activity: Next  Available Appointment - Follow up MD, Schedule Surgery          Document Letter(s):  Created for Patient: Clinical Summary         Notes:   He has a left 7 mm proximal ureteral calculi. Currently asymptomatic but had colic pain and nausea/vomiting a few days ago that resolved with use of Tylenol and ibuprofen. UA with correlating microscopic hematuria.   For observation I described the risks which include but are not limited to silent renal damage, life-threatening infection, need for emergent surgery, failure to pass stone, and pain.   For ureteroscopy I described the risks which include heart attack, stroke, pulmonary embolus, death, bleeding, infection, damage to contiguous structures, positioning injury, ureteral stricture, ureteral avulsion, ureteral injury, need for ureteral stent, inability to perform ureteroscopy, need for an interval procedure, inability to clear stone burden, stent discomfort and pain.   For shockwave lithotripsy I described the risks which include arrhythmia, kidney contusion, kidney hemorrhage, need for transfusion, long-term risk of diabetes or hypertension, back discomfort, flank ecchymosis, flank abrasion, inability to break up stone, inability to pass stone fragments, Steinstrasse, infection associated with obstructing stones, need for different surgical procedure and possible need for repeat shockwave lithotripsy.   We will proceed with getting him set up for shockwave lithotripsy. I sent in prescriptions for pain medication, antinausea medication and tamsulosin which he will start and continue daily through time of the procedure. Encouraged to stay well-hydrated and nourished. He can take NSAIDs up to 3 days prior to the pending lithotripsy procedure reserving the stronger pain medication for more severe exacerbations. ED and return to clinic instructions for worsening symptomology including uncontrollable pain, uncontrollable nausea/vomiting, painful ability to  void, fever/chills as well as nausea and vomiting discussed. Precautionary urine culture sent today.

## 2023-01-25 NOTE — Op Note (Signed)
See [facility] OP note scanned into chart. Also because of the size, density, location and other factors that cannot be anticipated I feel this will likely be a staged procedure. This fact supersedes any indication in the scanned [facility] operative note to the contrary.  

## 2023-01-28 ENCOUNTER — Encounter (HOSPITAL_BASED_OUTPATIENT_CLINIC_OR_DEPARTMENT_OTHER): Payer: Self-pay | Admitting: Urology
